# Patient Record
Sex: Male | Born: 1985 | Race: White | Hispanic: No | Marital: Single | Smoking: Former smoker
Health system: Southern US, Community
[De-identification: ages and names within clinical notes are randomized; demographics above are authoritative.]

## PROBLEM LIST (undated history)

## (undated) DIAGNOSIS — F1910 Other psychoactive substance abuse, uncomplicated: Secondary | ICD-10-CM

## (undated) DIAGNOSIS — A4902 Methicillin resistant Staphylococcus aureus infection, unspecified site: Secondary | ICD-10-CM

## (undated) DIAGNOSIS — F1120 Opioid dependence, uncomplicated: Secondary | ICD-10-CM

## (undated) DIAGNOSIS — T402X1A Poisoning by other opioids, accidental (unintentional), initial encounter: Secondary | ICD-10-CM

## (undated) DIAGNOSIS — F1110 Opioid abuse, uncomplicated: Secondary | ICD-10-CM

## (undated) DIAGNOSIS — B1920 Unspecified viral hepatitis C without hepatic coma: Secondary | ICD-10-CM

## (undated) DIAGNOSIS — F1990 Other psychoactive substance use, unspecified, uncomplicated: Secondary | ICD-10-CM

## (undated) HISTORY — PX: APPENDECTOMY: SHX54

---
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
## 2007-05-16 ENCOUNTER — Inpatient Hospital Stay (HOSPITAL_COMMUNITY): Admission: EM | Admit: 2007-05-16 | Discharge: 2007-05-17 | Payer: Self-pay | Admitting: General Surgery

## 2007-09-12 ENCOUNTER — Inpatient Hospital Stay (HOSPITAL_COMMUNITY): Admission: EM | Admit: 2007-09-12 | Discharge: 2007-09-13 | Payer: Self-pay | Admitting: Emergency Medicine

## 2007-09-13 ENCOUNTER — Encounter (INDEPENDENT_AMBULATORY_CARE_PROVIDER_SITE_OTHER): Payer: Self-pay | Admitting: General Surgery

## 2010-11-07 NOTE — H&P (Signed)
NAME:  Samuel Weber, Samuel Weber               ACCOUNT NO.:  [id number]   MEDICAL RECORD NO.:  [id number]          PATIENT TYPE:  INP   LOCATION:  1302                         FACILITY:  [facility]   PHYSICIAN:  Angelia Mould. Derrell Lolling, M.D.DATE OF BIRTH:  07/25/1985   DATE OF ADMISSION:  09/12/2007  DATE OF DISCHARGE:                              HISTORY & PHYSICAL   CHIEF COMPLAINT:  Abdominal pain.   HISTORY OF PRESENT ILLNESS:  This is a 25 year old white man who noted  the gradual onset of right lower quadrant pain at 2:00 p.m. yesterday,  Thursday, March 19.  He vomited yesterday.  The pain was pretty bad last  night.  It is not quite as bad today, but he still has pain.  He is  having normal bowel movements.  He is having no trouble voiding.  He has  had some chills but does not think he has had any fever.  He came to the  [facility] emergency room this evening for evaluation.  Lab work shows  an elevated white blood cell count, and a CT scan shows a thickened  inflamed appendix but no evidence of rupture or abscess.  I was called  to see him.  He is being admitted for management of his acute  appendicitis.   PAST MEDICAL HISTORY:  Abscessed right neck cyst excised by Dr. Estelle Grumbles in the past.  Mild asthma, on no treatment.   CURRENT MEDICATIONS:  None.   DRUG ALLERGIES:  None known.   FAMILY HISTORY:  Mother living.  Has had a history of melanoma and some  type of heart valve problem.  Father living and well.  The patient is an  only child.   SOCIAL HISTORY:  The patient is single, lives in [place] with his  mother and girlfriend.  He works for [employer], unloading trucks.  He smokes a  half a pack of cigarettes per day.  Drinks alcohol rarely.   REVIEW OF SYSTEMS:  Fifteen-system review of systems is performed and is  noncontributory except as described above.   PHYSICAL EXAMINATION:  GENERAL:  A thin, cooperative young man in mild  distress.  VITAL SIGNS:  Temperature 99.1, pulse 78,  respirations 16, blood  pressure 133/53.  HEENT:  Eyes:  Sclerae are clear.  Extraocular movements intact.  Ear,  nose, mouth and throat:  Nose, lips, tongue and oropharynx are without  gross lesions.  NECK:  Supple, nontender.  No mass.  Well-healed scar in the right  submandibular area.  No jugular venous distention.  LUNGS:  Clear to auscultation.  No wheezing.  No chest wall tenderness.  HEART:  Regular rate and rhythm.  No murmur.  Radial and posterior  tibial pulses are palpable.  No peripheral edema.  ABDOMEN:  Scaphoid.  He has localized tenderness and guarding in the  right lower quadrant, actually below McBurney's point.  There is no  mass.  It is fairly soft elsewhere.  There is no hernia.  There is no  inguinal mass or hernia either.  EXTREMITIES:  Moves all four extremities well without pain or  deformity.  NEUROLOGIC:  No gross motor or sensory deficits.   ADMISSION DATA:  A CT scan described above showing apparent  uncomplicated acute appendicitis.  Hemoglobin 15.6, white blood cell  count 16,700.  Complete metabolic panel basically normal.  Urinalysis is  positive for protein but otherwise negative.   ASSESSMENT:  Acute appendicitis.   PLAN:  1. The patient will be admitted, started on intravenous antibiotics.  2. He will be taken to the operating room for diagnostic laparoscopy      and appendectomy.   I have discussed the indications and details of the surgery with the  patient, his mother and his girlfriend.  Risks and complications have  been outlined, including but not limited to bleeding, infection,  conversion to open laparotomy.  Possibility of alternative diagnosis  mentioned.  Injury to adjacent organs such as major vascular structure,  ureter or intestine with major reconstructive surgery.  Cardiac,  pulmonary and thromboembolic problems.  He seems to understand these  issues well.  At this time, all of his questions are answered.  He is in  full  agreement with this plan.      Angelia Mould. Derrell Lolling, M.D.  Electronically Signed     HMI/MEDQ  D:  09/12/2007  T:  09/13/2007  Job:  [id number]   cc:   Stacie Acres. Cliffton Asters, M.D.  Fax: [phone]

## 2010-11-07 NOTE — Op Note (Signed)
NAME:  Samuel Weber, Samuel Weber               ACCOUNT NO.:  [id number]   MEDICAL RECORD NO.:  [id number]          PATIENT TYPE:  INP   LOCATION:  1302                         FACILITY:  [facility]   PHYSICIAN:  Angelia Mould. Derrell Lolling, M.D.DATE OF BIRTH:  [birth year]   DATE OF PROCEDURE:  09/13/2007  DATE OF DISCHARGE:                               OPERATIVE REPORT   PREOPERATIVE DIAGNOSIS:  Acute appendicitis.   POSTOPERATIVE DIAGNOSIS:  Acute appendicitis.   OPERATION PERFORMED:  Laparoscopic appendectomy.   SURGEON:  Angelia Mould. Derrell Lolling, M.D.   OPERATIVE INDICATIONS:  This is a 25 year old white man who presented to  the emergency room with a 24 hour history of right lower quadrant pain,  nausea and vomiting.  On exam he was found to have localized tenderness  in the right lower quadrant and right suprapubic area.  A CT scan  confirmed an inflamed appendix but no evidence of rupture.  He is  brought to the operating room urgently   OPERATIVE FINDINGS:  The appendix was acutely inflamed but had not  ruptured.  The omentum was adherent to it but was able to be peeled away  without too much trouble.  The last foot or two of terminal ileum looked  normal.  The cecum and right colon looked normal.  The liver and  gallbladder looked normal.  There were no abnormal findings in the  pelvis.   PROCEDURE IN DETAIL:  Following the induction of general endotracheal  anesthesia, Foley catheter was inserted.  The patient was identified as  the correct patient and correct procedure.  The abdomen was prepped and  draped in a sterile fashion.  Intravenous antibiotics had been given  prior to the incision.  Marcaine 0.5% with epinephrine was used as local  infiltration anesthetic.  A vertically oriented incision was made at the  superior rim of the umbilicus.  The fascia was incised in the midline  and the abdominal cavity entered under direct vision.  A 10 mm Hassan  trocar was inserted and secured with a  pursestring suture of zero  Vicryl.  Pneumoperitoneum was created.  Video camera was inserted with  visualization and findings as described above.  A 12 mm trocar was  placed in the left suprapubic area and a 5 mm trocar placed in the left  lower quadrant midway between the two trocars.  The omentum was  carefully teased away from the appendix.  The terminal ileum and cecum  were evaluated.  The appendix was grasped and elevated.  Some lateral  peritoneal attachments were divided with the Harmonic scalpel.  We could  then visualize clearly the anatomy.  We divided the appendiceal  mesentery with the Harmonic scalpel.  This took a few steps but  ultimately we had the appendix completely skeletonized so we could  clearly visualize the junction of the appendix with the cecum.  An Endo-  GIA stapling device was inserted and placed transversely across the base  of the appendix at the level of the cecum.  The stapler was closed, held  in place for about 30 seconds and then fired  and removed.  The staple  line looked good and it is very clean.  There was no bleeding and very  secure.  The appendix was placed in a specimen bag and removed.  The  operative field was irrigated a little bit but there was absolutely no  bleeding and all the irrigation fluid was clear and it was evacuated.  The trocars were removed.  There was no bleeding from the trocar sites.  The pneumoperitoneum was released.  The fascia at the umbilicus and the  fascia at the left suprapubic trocar site were closed with zero Vicryl  sutures.  The skin incisions were closed with subcuticular sutures of 4-  0 Monocryl and Dermabond.  Clean bandages were placed and the patient  was taken to the recovery room in stable condition.   ESTIMATED BLOOD LOSS:  Was about 10 mL.   COMPLICATIONS:  None.   COUNTS:  Sponge, needle and instrument counts were correct.      Angelia Mould. Derrell Lolling, M.D.  Electronically Signed     HMI/MEDQ   D:  09/13/2007  T:  09/13/2007  Job:  [id number]

## 2010-11-07 NOTE — Op Note (Signed)
NAME:  Samuel Weber, Samuel Weber               ACCOUNT NO.:  [id number]   MEDICAL RECORD NO.:  [id number]          PATIENT TYPE:  INP   LOCATION:  1529                         FACILITY:  [facility]   PHYSICIAN:  Ardeth Sportsman, MD     DATE OF BIRTH:  05-Mar-1986   DATE OF PROCEDURE:  05/16/2007  DATE OF DISCHARGE:                               OPERATIVE REPORT   PRIMARY CARE PHYSICIAN:  Unfortunately I do not have.   PREOPERATIVE DIAGNOSIS:  Right neck abscess.   POSTOPERATIVE DIAGNOSIS:  Infected cystic mass of right lateral neck.   PROCEDURE PERFORMED:  Incision and drainage of right neck abscess and  excision of cystic mass.   SURGEON:  Ardeth Sportsman, MD.   ASSISTANTS:  None.   ANESTHESIA:  1. General anesthesia.  2. Local anesthetic in a field block.   SPECIMENS:  Cystic abscess cavity fluid for culture, aerobic and  anaerobic, and cystic wall sent to Pathology.   DRAINS:  He has a one-inch wide Nugauze with some light Betadine packed  into the wound.   ESTIMATED BLOOD LOSS:  Less than 5 ml.   COMPLICATIONS:  None apparent.   INDICATIONS:  Mr. Makela is a 25 year old, otherwise healthy male, who  noticed a bulge in his right neck over his sternocleidomastoid  underneath the angle of his jaw that gradually increased in size.  Because it had worsened, he was evaluated in the Surgical Clinic today,  and recommendation was made for incision and drainage.  Given its  location and its size of being about 3 x 4 x 5 cm in size,  recommendation was made to do this in the operating room under a more  controlled situation.   Risks such as stroke, heart attack, DVT, thrombosis, pulmonary embolism,  and death were discussed.  Risks such as bleeding, need for transfusion,  hematoma, wound infection, abscess, injury to other organs, recurrence  of cystic mass, and other risks were discussed.  Question answered, and  he agrees to proceed.   OPERATIVE FINDINGS:  He had a large cystic mass with  a lot of thickened  sebum consistent with an infected sebaceous cyst with a thickened wall.  It rested primarily on the sternocleidomastoid and was somewhat anterior  to it inferior to the angle of the mandible.  It was contained and not  seen to be invading into the deeper neck structures but was primarily in  the superficial platysma.   DESCRIPTION OF PROCEDURE:  Informed consent was confirmed.  The patient  received IV Vancomycin.  He underwent a general anesthesia without any  difficulty.  He was positioned supine with his neck turned towards the  left and I exposed his right neck.  His face and chest were clipped,  prepped, and draped in a sterile fashion.  A seeker needle was used over  the most fluctuant area of the mass, and I was able to aspirate some  thickened cottage-cheese material.   A 1 cm oblique incision parallel to the sternocleidomastoid was made,  and we immediately evacuated a large amount of infected, cottage-cheese-  thick,  sebaceous material.  This was sent for culture.  The area was  irrigated out, and the cavity had an obvious cystic wall to it.  This  was freed off using a controlled blunt and sharp dissection and removed.  Copious irrigation was done with a nice clear return, and hemostasis was  excellent.  There was no evidence of any injury to any of the deeper  structures or involvement to any of the deeper structures.  I could not  detect any deeper cavity consistent with like a brachial cleft cyst or  any other abnormality.  The wound was irrigated copiously with saline  and packed with one-inch wide gauze gently soaked in Betadine with dry  gauze over it.  He was extubated and sent to the recovery room in stable  condition.   I explained the operative findings to patient's family.  Instructions  were discussed.  Questions answered, and each expressed understanding  and appreciation.      Ardeth Sportsman, MD  Electronically Signed     SCG/MEDQ   D:  05/16/2007  T:  05/17/2007  Job:  [id number]

## 2011-03-19 LAB — CBC
Hemoglobin: 15.6
MCHC: 34.8
RBC: 4.62

## 2011-03-19 LAB — URINALYSIS, ROUTINE W REFLEX MICROSCOPIC
Ketones, ur: NEGATIVE
Leukocytes, UA: NEGATIVE
Nitrite: NEGATIVE
Protein, ur: 30 — AB
Urobilinogen, UA: 1

## 2011-03-19 LAB — LIPASE, BLOOD: Lipase: 20

## 2011-03-19 LAB — COMPREHENSIVE METABOLIC PANEL
ALT: 15
Alkaline Phosphatase: 53
CO2: 25
Calcium: 8.8
GFR calc non Af Amer: >60
Glucose, Bld: 104 — ABNORMAL HIGH
Sodium: 130 — ABNORMAL LOW

## 2011-03-19 LAB — DIFFERENTIAL
Basophils Absolute: 0.1
Basophils Relative: 0
Eosinophils Absolute: 0
Lymphs Abs: 2.1
Neutrophils Relative %: 79 — ABNORMAL HIGH

## 2011-04-03 LAB — ANAEROBIC CULTURE

## 2011-04-03 LAB — BASIC METABOLIC PANEL
CO2: 23
Calcium: 9.2
Creatinine, Ser: 0.94
Glucose, Bld: 108 — ABNORMAL HIGH

## 2011-04-03 LAB — CULTURE, ROUTINE-ABSCESS

## 2011-04-03 LAB — CBC
MCHC: 34.6
MCV: 97.7
RDW: 13.2

## 2014-02-28 ENCOUNTER — Encounter (HOSPITAL_COMMUNITY): Payer: Self-pay | Admitting: Emergency Medicine

## 2014-02-28 ENCOUNTER — Emergency Department (HOSPITAL_COMMUNITY)
Admission: EM | Admit: 2014-02-28 | Discharge: 2014-03-01 | Disposition: A | Discharge status: Home / Self Care | Payer: Self-pay | Attending: Emergency Medicine | Admitting: Emergency Medicine

## 2014-02-28 ENCOUNTER — Emergency Department (HOSPITAL_COMMUNITY): Payer: Self-pay

## 2014-02-28 DIAGNOSIS — F1123 Opioid dependence with withdrawal: Secondary | ICD-10-CM

## 2014-02-28 DIAGNOSIS — F1110 Opioid abuse, uncomplicated: Secondary | ICD-10-CM

## 2014-02-28 DIAGNOSIS — J209 Acute bronchitis, unspecified: Secondary | ICD-10-CM | POA: Insufficient documentation

## 2014-02-28 DIAGNOSIS — R636 Underweight: Secondary | ICD-10-CM | POA: Insufficient documentation

## 2014-02-28 DIAGNOSIS — J40 Bronchitis, not specified as acute or chronic: Secondary | ICD-10-CM

## 2014-02-28 DIAGNOSIS — R45851 Suicidal ideations: Secondary | ICD-10-CM

## 2014-02-28 DIAGNOSIS — F17200 Nicotine dependence, unspecified, uncomplicated: Secondary | ICD-10-CM | POA: Insufficient documentation

## 2014-02-28 HISTORY — DX: Opioid abuse, uncomplicated: F11.10

## 2014-02-28 LAB — COMPREHENSIVE METABOLIC PANEL
ALK PHOS: 61 U/L (ref 39–117)
ALT: 5 U/L (ref 0–53)
ANION GAP: 14 (ref 5–15)
AST: 10 U/L (ref 0–37)
Albumin: 3.8 g/dL (ref 3.5–5.2)
BILIRUBIN TOTAL: 0.2 mg/dL — AB (ref 0.3–1.2)
BUN: 12 mg/dL (ref 6–23)
CHLORIDE: 104 meq/L (ref 96–112)
CO2: 22 mEq/L (ref 19–32)
Calcium: 9.6 mg/dL (ref 8.4–10.5)
Creatinine, Ser: 0.91 mg/dL (ref 0.50–1.35)
GFR calc non Af Amer: >90 mL/min (ref >90)
GLUCOSE: 92 mg/dL (ref 70–99)
POTASSIUM: 4.3 meq/L (ref 3.7–5.3)
Sodium: 140 mEq/L (ref 137–147)
Total Protein: 7.9 g/dL (ref 6.0–8.3)

## 2014-02-28 LAB — CBC
HEMATOCRIT: 42 % (ref 39.0–52.0)
HEMOGLOBIN: 14.2 g/dL (ref 13.0–17.0)
MCH: 32.3 pg (ref 26.0–34.0)
MCHC: 33.8 g/dL (ref 30.0–36.0)
MCV: 95.7 fL (ref 78.0–100.0)
Platelets: 381 10*3/uL (ref 150–400)
RBC: 4.39 MIL/uL (ref 4.22–5.81)
RDW: 13.1 % (ref 11.5–15.5)
WBC: 10.1 10*3/uL (ref 4.0–10.5)

## 2014-02-28 LAB — ACETAMINOPHEN LEVEL

## 2014-02-28 LAB — ETHANOL: Alcohol, Ethyl (B): <11 mg/dL (ref 0–11)

## 2014-02-28 LAB — SALICYLATE LEVEL: Salicylate Lvl: <2 mg/dL — ABNORMAL LOW (ref 2.8–20.0)

## 2014-02-28 MED ORDER — CLONIDINE HCL 0.1 MG PO TABS
0.1000 mg | ORAL_TABLET | Freq: Four times a day (QID) | ORAL | Status: DC
  Administered 2014-02-28 – 2014-03-01 (×4): 0.1 mg via ORAL
  Filled 2014-02-28 (×4): qty 1

## 2014-02-28 MED ORDER — AZITHROMYCIN 250 MG PO TABS
250.0000 mg | ORAL_TABLET | Freq: Every day | ORAL | Status: DC
  Administered 2014-03-01: 250 mg via ORAL
  Filled 2014-02-28: qty 1

## 2014-02-28 MED ORDER — NAPROXEN 500 MG PO TABS
500.0000 mg | ORAL_TABLET | Freq: Two times a day (BID) | ORAL | Status: DC | PRN
  Administered 2014-03-01: 500 mg via ORAL
  Filled 2014-02-28: qty 1

## 2014-02-28 MED ORDER — AZITHROMYCIN 250 MG PO TABS
500.0000 mg | ORAL_TABLET | Freq: Every day | ORAL | Status: AC
  Administered 2014-02-28: 500 mg via ORAL
  Filled 2014-02-28: qty 2

## 2014-02-28 MED ORDER — METHOCARBAMOL 500 MG PO TABS
1000.0000 mg | ORAL_TABLET | Freq: Four times a day (QID) | ORAL | Status: DC | PRN
  Administered 2014-03-01: 1000 mg via ORAL
  Filled 2014-02-28: qty 2

## 2014-02-28 MED ORDER — ALBUTEROL SULFATE (2.5 MG/3ML) 0.083% IN NEBU
5.0000 mg | INHALATION_SOLUTION | Freq: Once | RESPIRATORY_TRACT | Status: AC
  Administered 2014-02-28: 5 mg via RESPIRATORY_TRACT
  Filled 2014-02-28: qty 6

## 2014-02-28 MED ORDER — ALBUTEROL SULFATE (2.5 MG/3ML) 0.083% IN NEBU
5.0000 mg | INHALATION_SOLUTION | RESPIRATORY_TRACT | Status: DC | PRN
  Filled 2014-02-28: qty 6

## 2014-02-28 MED ORDER — CLONIDINE HCL 0.1 MG PO TABS: 0.1000 mg | ORAL_TABLET | ORAL | Status: DC

## 2014-02-28 MED ORDER — LOPERAMIDE HCL 2 MG PO CAPS: 2.0000 mg | ORAL_CAPSULE | ORAL | Status: DC | PRN

## 2014-02-28 MED ORDER — CLONIDINE HCL 0.1 MG PO TABS: 0.1000 mg | ORAL_TABLET | Freq: Every day | ORAL | Status: DC

## 2014-02-28 MED ORDER — DM-GUAIFENESIN ER 30-600 MG PO TB12
1.0000 | ORAL_TABLET | Freq: Two times a day (BID) | ORAL | Status: DC | PRN
  Filled 2014-02-28: qty 1

## 2014-02-28 MED ORDER — HYDROXYZINE HCL 25 MG PO TABS
50.0000 mg | ORAL_TABLET | Freq: Four times a day (QID) | ORAL | Status: DC | PRN
  Administered 2014-02-28: 50 mg via ORAL
  Filled 2014-02-28: qty 2

## 2014-02-28 MED ORDER — DICYCLOMINE HCL 20 MG PO TABS: 20.0000 mg | ORAL_TABLET | Freq: Four times a day (QID) | ORAL | Status: DC | PRN

## 2014-02-28 MED ORDER — NICOTINE 21 MG/24HR TD PT24
21.0000 mg | MEDICATED_PATCH | Freq: Every day | TRANSDERMAL | Status: DC
  Administered 2014-02-28 – 2014-03-01 (×2): 21 mg via TRANSDERMAL
  Filled 2014-02-28 (×2): qty 1

## 2014-02-28 MED ORDER — ACETAMINOPHEN 325 MG PO TABS: 650.0000 mg | ORAL_TABLET | ORAL | Status: DC | PRN

## 2014-02-28 MED ORDER — ALUM & MAG HYDROXIDE-SIMETH 200-200-20 MG/5ML PO SUSP: 30.0000 mL | ORAL | Status: DC | PRN

## 2014-02-28 MED ORDER — ONDANSETRON 4 MG PO TBDP: 4.0000 mg | ORAL_TABLET | Freq: Four times a day (QID) | ORAL | Status: DC | PRN

## 2014-02-28 MED ORDER — IPRATROPIUM BROMIDE 0.02 % IN SOLN
0.5000 mg | Freq: Once | RESPIRATORY_TRACT | Status: AC
  Administered 2014-02-28: 0.5 mg via RESPIRATORY_TRACT
  Filled 2014-02-28: qty 2.5

## 2014-02-28 NOTE — BH Assessment (Signed)
Assessment completed. Consulted Dr. Jannifer Franklin who recommended inpatient treatment. [facility] at capacity. TTS will contact other facilities for placement. Dr. Lynelle Doctor is aware of recommendation.

## 2014-02-28 NOTE — BH Assessment (Signed)
Tele Assessment Note   Samuel Weber is an 28 y.o. male presenting to [facility] ED with suicidal ideations and a plan to overdose on heroin. Pt stated "I am tired of waking up feeling like this".   Pt is endorsing SI with a plan to overdose on heroin. Pt did not report any previous suicide attempts and denied ever being hospitalized for any psychiatric issues. Pt did not report any previous mental health treatment. Pt is endorsing multiple depressive symptoms and shared that his sleep and appetite are poor. Pt also shared that he his dealing with multiple stressors such as relationship problems, financial issues as well as legal issues. Pt denies HI and AVH at this time. Pt reported that he has access to knives and shared that he has a pending DUI charge but is unaware of his court date. Pt stated "I think it's in December". PT reported that he has been abusing heroin for the past 2 years and he uses up to 2 grams a day. Pt did not report any alcohol use and denied any other drug use. Pt did not report any physical, sexual or emotional abuse at this time. Pt reported that he lives at home with his mother and she is a part of his support system. Pt is currently unemployed.  Pt is drowsy but oriented x3. Pt is calm and cooperative. Pt is dressed in scrubs and maintains poor eye contact. Pt speech is soft and mumbled at times. Pt mood is depressed and his affect is congruent with his mood. Pt thought process is coherent and relevant and his judgement is fair.  Axis I: Opioid use disorder, Moderate Axis II: Deferred Axis III:  Past Medical History  Diagnosis Date  . Heroin abuse    Axis IV: economic problems, occupational problems and problems related to legal system/crime Axis V: 11-20 some danger of hurting self or others possible OR occasionally fails to maintain minimal personal hygiene OR gross impairment in communication  Past Medical History:  Past Medical History  Diagnosis Date  . Heroin abuse      Past Surgical History  Procedure Laterality Date  . Appendectomy      Family History: History reviewed. No pertinent family history.  Social History:  reports that he has been smoking.  He does not have any smokeless tobacco history on file. He reports that he uses illicit drugs (IV, Cocaine, and Marijuana). He reports that he does not drink alcohol.  Additional Social History:  Alcohol / Drug Use History of alcohol / drug use?: Yes Substance #1 Name of Substance 1: Hreroin  1 - Age of First Use: 25 1 - Amount (size/oz): 2 grams  1 - Frequency: daily  1 - Duration: 2 years  1 - Last Use / Amount: 02-28-14 @ 3am- 1 shot   CIWA: CIWA-Ar BP: 126/88 mmHg Pulse Rate: 87 COWS:    PATIENT STRENGTHS: (choose at least two) Average or above average intelligence Supportive family/friends  Allergies: No Known Allergies  Home Medications:  (Not in a hospital admission)  OB/GYN Status:  No LMP for male patient.  General Assessment Data Location of Assessment: [facility] ED Is this a Tele or Face-to-Face Assessment?: Face-to-Face Is this an Initial Assessment or a Re-assessment for this encounter?: Initial Assessment Living Arrangements: Parent Can pt return to current living arrangement?: Yes Admission Status: Voluntary Is patient capable of signing voluntary admission?: Yes Transfer from: Home Referral Source: Self/Family/Friend     [facility] Crisis Care Plan Living Arrangements:  Parent Name of Psychiatrist: None reported Name of Therapist: None reported  Education Status Is patient currently in school?: No Current Grade: NA Highest grade of school patient has completed: GED Name of school: NA Contact person: NA  Risk to self with the past 6 months Suicidal Ideation: Yes-Currently Present Suicidal Intent: Yes-Currently Present Is patient at risk for suicide?: Yes Suicidal Plan?: Yes-Currently Present Specify Current Suicidal Plan: Overdose on heroin  Access to Means:  Yes Specify Access to Suicidal Means: Pt abuses heroin daily. What has been your use of drugs/alcohol within the last 12 months?: Heroin use daily.  Previous Attempts/Gestures: No How many times?: 0 Other Self Harm Risks: No other self harm risk identified at this time. Triggers for Past Attempts: None known Intentional Self Injurious Behavior: None Family Suicide History: No Recent stressful life event(s): Financial Problems;Legal Issues;Other (Comment) (Relationship issues) Persecutory voices/beliefs?: No Depression: Yes Depression Symptoms: Despondent;Insomnia;Fatigue;Guilt;Loss of interest in usual pleasures;Feeling worthless/self pity;Feeling angry/irritable Substance abuse history and/or treatment for substance abuse?: Yes Suicide prevention information given to non-admitted patients: Not applicable  Risk to Others within the past 6 months Homicidal Ideation: No Thoughts of Harm to Others: No Current Homicidal Intent: No Current Homicidal Plan: No Access to Homicidal Means: No Identified Victim: NA History of harm to others?: No Assessment of Violence: None Noted Violent Behavior Description: No violent behaviors observed. Pt is calm and cooperative at this time. Does patient have access to weapons?: Yes (Comment) (Knives) Criminal Charges Pending?: Yes Describe Pending Criminal Charges: DUI Does patient have a court date: Yes Court Date:  (December 2015)  Psychosis Hallucinations: None noted Delusions: None noted  Mental Status Report Appear/Hygiene: In scrubs Eye Contact: Poor Motor Activity: Freedom of movement Speech: Soft Level of Consciousness: Quiet/awake Mood: Depressed Affect: Appropriate to circumstance Anxiety Level: None Thought Processes: Coherent;Relevant Judgement: Partial Orientation: Place;Person;Situation Obsessive Compulsive Thoughts/Behaviors: None  Cognitive Functioning Concentration: Normal Memory: Recent Intact IQ: Average Insight:  Fair Impulse Control: Fair Appetite: Poor Weight Loss: 0 Weight Gain: 0 Sleep: Decreased Total Hours of Sleep: 2 Vegetative Symptoms: Decreased grooming  ADLScreening [facility] Assessment Services) Patient's cognitive ability adequate to safely complete daily activities?: Yes Patient able to express need for assistance with ADLs?: Yes Independently performs ADLs?: Yes (appropriate for developmental age)  Prior Inpatient Therapy Prior Inpatient Therapy: No  Prior Outpatient Therapy Prior Outpatient Therapy: No  ADL Screening (condition at time of admission) Patient's cognitive ability adequate to safely complete daily activities?: Yes Is the patient deaf or have difficulty hearing?: No Does the patient have difficulty seeing, even when wearing glasses/contacts?: No Does the patient have difficulty concentrating, remembering, or making decisions?: No Patient able to express need for assistance with ADLs?: Yes Does the patient have difficulty dressing or bathing?: No Independently performs ADLs?: Yes (appropriate for developmental age) Does the patient have difficulty walking or climbing stairs?: No       Abuse/Neglect Assessment (Assessment to be complete while patient is alone) Physical Abuse: Denies Verbal Abuse: Denies Sexual Abuse: Denies Exploitation of patient/patient's resources: Denies Self-Neglect: Denies Values / Beliefs Cultural Requests During Hospitalization: None Spiritual Requests During Hospitalization: None   Advance Directives (For Healthcare) Does patient have an advance directive?: No    Additional Information 1:1 In Past 12 Months?: No CIRT Risk: No Elopement Risk: No Does patient have medical clearance?: Yes     Disposition:  Disposition Initial Assessment Completed for this Encounter: Yes Disposition of Patient: Inpatient treatment program Type of inpatient treatment program: Adult  Lawana Hartzell S 02/28/2014 8:27 PM

## 2014-02-28 NOTE — ED Provider Notes (Signed)
CSN: [id number]     Arrival date & time 02/28/14  1716 History   First MD Initiated Contact with Patient 02/28/14 1759     Chief Complaint  Patient presents with  . Suicidal   . Opiate Detox      (Consider location/radiation/quality/duration/timing/severity/associated sxs/prior Treatment) HPI Patient states he has been abusing heroin for about 2 years. He states he has never done rehabilitation or detox. He states "I'm tired of using". When asked if he is depressed he states no. However his mother expresses concern that he's going to intentionally hurt himself and he admits that he would do that. Patient states he's using 2 g daily. He states his girlfriend gives him the heroin for free. She states she does not have a steady job. Patient was working in [employer] however he's not worked for couple months. Patient states he was arrested for selling drugs about 3 years ago, he also currently has a failure to appear for a traffic ticket. Patient states he is tired of waking up in feeling sad because he is in withdrawal. He states he is tired of being an addict. Patient also has had a cough for several weeks, sometimes he has some wheezing. His mother states when he was a child he had to have nebulizers and inhalers. His mother states she just found out he was getting the heroin although she had suspected something was happening.  PCP none  Past Medical History  Diagnosis Date  . Heroin abuse    Past Surgical History  Procedure Laterality Date  . Appendectomy     History reviewed. No pertinent family history. History  Substance Use Topics  . Smoking status: Current Every Day Smoker  . Smokeless tobacco: Not on file  . Alcohol Use: No   unemployed for a few months Smokes one pack per day  Review of Systems  All other systems reviewed and are negative.     Allergies  Review of patient's allergies indicates no known allergies.  Home Medications   Prior to Admission medications    Not on File   BP 126/88  Pulse 87  Temp(Src) 98.3 F (36.8 C) (Oral)  Resp 16  SpO2 100%  Vital signs normal   Physical Exam  Nursing note and vitals reviewed. Constitutional: He is oriented to person, place, and time.  Non-toxic appearance. He does not appear ill. No distress.  Underweight, laying on his side curled in a ball  HENT:  Head: Normocephalic and atraumatic.  Right Ear: External ear normal.  Left Ear: External ear normal.  Nose: Nose normal. No mucosal edema or rhinorrhea.  Mouth/Throat: Oropharynx is clear and moist and mucous membranes are normal. No dental abscesses or uvula swelling.  Having rhinorrhea  Eyes: Conjunctivae and EOM are normal. Pupils are equal, round, and reactive to light.  Neck: Normal range of motion and full passive range of motion without pain. Neck supple.  Cardiovascular: Normal rate, regular rhythm and normal heart sounds.  Exam reveals no gallop and no friction rub.   No murmur heard. Pulmonary/Chest: Effort normal. No respiratory distress. He has no wheezes. He has rhonchi. He has no rales. He exhibits no tenderness and no crepitus.  Coughing frequently with course breath sounds and scattered rhonchi  Abdominal: Soft. Normal appearance and bowel sounds are normal. He exhibits no distension. There is no tenderness. There is no rebound and no guarding.  Musculoskeletal: Normal range of motion. He exhibits no edema and no tenderness.  Moves all extremities  well.   Neurological: He is alert and oriented to person, place, and time. He has normal strength. No cranial nerve deficit.  Skin: Skin is warm, dry and intact. No rash noted. No erythema. No pallor.  Psychiatric: He has a normal mood and affect. His speech is normal and behavior is normal. His mood appears not anxious.    ED Course  Procedures (including critical care time)  Medications  acetaminophen (TYLENOL) tablet 650 mg (not administered)  nicotine (NICODERM CQ - dosed in mg/24  hours) patch 21 mg (21 mg Transdermal Patch Applied 02/28/14 1937)  alum & mag hydroxide-simeth (MAALOX/MYLANTA) 200-200-20 MG/5ML suspension 30 mL (not administered)  dicyclomine (BENTYL) tablet 20 mg (not administered)  hydrOXYzine (ATARAX/VISTARIL) tablet 50 mg (not administered)  loperamide (IMODIUM) capsule 2-4 mg (not administered)  methocarbamol (ROBAXIN) tablet 1,000 mg (not administered)  naproxen (NAPROSYN) tablet 500 mg (not administered)  ondansetron (ZOFRAN-ODT) disintegrating tablet 4 mg (not administered)  cloNIDine (CATAPRES) tablet 0.1 mg (0.1 mg Oral Given 02/28/14 1937)    Followed by  cloNIDine (CATAPRES) tablet 0.1 mg (not administered)    Followed by  cloNIDine (CATAPRES) tablet 0.1 mg (not administered)  dextromethorphan-guaiFENesin (MUCINEX DM) 30-600 MG per 12 hr tablet 1 tablet (not administered)  albuterol (PROVENTIL) (2.5 MG/3ML) 0.083% nebulizer solution 5 mg (not administered)  azithromycin (ZITHROMAX) tablet 500 mg (500 mg Oral Given 02/28/14 1937)    Followed by  azithromycin (ZITHROMAX) tablet 250 mg (not administered)  albuterol (PROVENTIL) (2.5 MG/3ML) 0.083% nebulizer solution 5 mg (5 mg Nebulization Given 02/28/14 1841)  ipratropium (ATROVENT) nebulizer solution 0.5 mg (0.5 mg Nebulization Given 02/28/14 1841)   Started on antibiotics for his bronchitis. He was given nebulizer treatment for his shortness of breath and wheezing.  19:39 [facility] called to discuss reason for consult  20:10 [facility] called, they recommend inpatient treatment. No beds at [facility] tonight.   Labs Review Results for orders placed during the hospital encounter of 02/28/14  ACETAMINOPHEN LEVEL      Result Value Ref Range   Acetaminophen (Tylenol), Serum <15.0  10 - 30 ug/mL  CBC      Result Value Ref Range   WBC 10.1  4.0 - 10.5 K/uL   RBC 4.39  4.22 - 5.81 MIL/uL   Hemoglobin 14.2  13.0 - 17.0 g/dL   HCT 16.1  09.6 - 04.5 %   MCV 95.7  78.0 - 100.0 fL   MCH 32.3  26.0 - 34.0 pg   MCHC 33.8   30.0 - 36.0 g/dL   RDW 40.9  81.1 - 91.4 %   Platelets 381  150 - 400 K/uL  COMPREHENSIVE METABOLIC PANEL      Result Value Ref Range   Sodium 140  137 - 147 mEq/L   Potassium 4.3  3.7 - 5.3 mEq/L   Chloride 104  96 - 112 mEq/L   CO2 22  19 - 32 mEq/L   Glucose, Bld 92  70 - 99 mg/dL   BUN 12  6 - 23 mg/dL   Creatinine, Ser 7.82  0.50 - 1.35 mg/dL   Calcium 9.6  8.4 - 95.6 mg/dL   Total Protein 7.9  6.0 - 8.3 g/dL   Albumin 3.8  3.5 - 5.2 g/dL   AST 10  0 - 37 U/L   ALT 5  0 - 53 U/L   Alkaline Phosphatase 61  39 - 117 U/L   Total Bilirubin 0.2 (*) 0.3 - 1.2 mg/dL   GFR calc  non Af Amer >90  >90 mL/min   GFR calc Af Amer >90  >90 mL/min   Anion gap 14  5 - 15  ETHANOL      Result Value Ref Range   Alcohol, Ethyl (B) <11  0 - 11 mg/dL  SALICYLATE LEVEL      Result Value Ref Range   Salicylate Lvl <2.0 (*) 2.8 - 20.0 mg/dL   Laboratory interpretation all normal except pending UDS      Imaging Review Dg Chest 2 View  02/28/2014   CLINICAL DATA:  Cough  EXAM: CHEST  2 VIEW  COMPARISON:  None.  FINDINGS: Lungs are clear.  No pleural effusion or pneumothorax.  The heart is normal in size.  Visualized osseous structures are within normal limits.  IMPRESSION: No evidence of acute cardiopulmonary disease.   Electronically Signed   By: Charline Bills M.D.   On: 02/28/2014 18:34     EKG Interpretation None      MDM   Final diagnoses:  Heroin abuse  Suicidal ideation  Bronchitis    Disposition pending  Devoria Albe, MD, Armando Gang     Ward Givens, MD 02/28/14 2234

## 2014-02-28 NOTE — ED Notes (Signed)
Pt states that he has been using heroin and wants to get off of it.  Pt states he is suicidal.  Started shooting it up 2 months ago.  Before that, he snorted.  Plan to OD on heroin.  Last use was 0300 this morning.

## 2014-02-28 NOTE — ED Notes (Signed)
Pt presents for detox from Heroin, SI with plan to OD on Heroin.  Last used Heroin at 3am.  Denies HI or AV hallucinations.  Pt reports no psych history, feeling hopeless.  Pt calm & cooperative at present.

## 2014-03-01 DIAGNOSIS — F1410 Cocaine abuse, uncomplicated: Secondary | ICD-10-CM

## 2014-03-01 DIAGNOSIS — F1120 Opioid dependence, uncomplicated: Secondary | ICD-10-CM

## 2014-03-01 DIAGNOSIS — F1123 Opioid dependence with withdrawal: Secondary | ICD-10-CM | POA: Diagnosis present

## 2014-03-01 DIAGNOSIS — F1210 Cannabis abuse, uncomplicated: Secondary | ICD-10-CM

## 2014-03-01 LAB — RAPID URINE DRUG SCREEN, HOSP PERFORMED
Amphetamines: NOT DETECTED
BARBITURATES: NOT DETECTED
Benzodiazepines: NOT DETECTED
Cocaine: POSITIVE — AB
Opiates: POSITIVE — AB
TETRAHYDROCANNABINOL: POSITIVE — AB

## 2014-03-01 NOTE — Consult Note (Signed)
[name]   Reason for Consult: "Patient requesting detox from heroin.'' Referring Physician:  [name] is an 28 y.o. male. Total Time spent with patient: 45 minutes  Assessment: AXIS I:  Opioid dependence with withdrawal               Cocaine abuse                Cannabis abuse AXIS II:  Cluster B Traits AXIS III:   Past Medical History  Diagnosis Date  . IV Track marks    AXIS IV:  other psychosocial or environmental problems and problems related to social environment AXIS V:  41-50 serious symptoms  Plan:  No evidence of imminent risk to self or others at present.   Patient does not meet criteria for psychiatric inpatient admission. Drugs residential treatment services recommended  Subjective:   Samuel Weber is a 28 y.o. male patient who presents with suicidal thoughts with no plan and Opioid withdrawal.  HPI:  Samuel Weber is 28 year old male who presents to the ER reporting that he has been abusing heroin for about 2 years almost on a daily basis. He states he has never done rehabilitation or detox. He states "I'm tired of using". When asked if he is depressed he states no. However his mother expresses concern that he's going to intentionally hurt himself and he admits that he would do that only if he does not get clean from using drugs. Patient states he's using 2 g of Heroine daily by "shooting up". He states his girlfriend gives him the heroin for free. Patient reports that he also uses cocaine and Marijuana occasionally. He is currently unemployed, used to work in [employer] however he's not worked for couple months. Patient states he was arrested for selling drugs about 3 years ago, he also currently has a failure to appear for a traffic ticket. Patient states he is tired of waking up and  Feeling anxious, having muscle pain, tiredness, diarrhea, rhinorrhea,  because he is in withdrawal. He states he is tired of being an addict. He denies  feeling suicidal or depressed today, he also denies auditory/visual hallucinations. Patient is currently on probation for selling Ecstasy and has a pending court date in December, 2015.  HPI Elements:   Location:  polysubstance abuse, opioid dependence. Severity:  daily use of Heroin. Duration:  2 years. Context:  frustrated with self .  Past Psychiatric History: Past Medical History  Diagnosis Date  . Heroin abuse     reports that he has been smoking.  He does not have any smokeless tobacco history on file. He reports that he uses illicit drugs (IV, Cocaine, and Marijuana). He reports that he does not drink alcohol. History reviewed. No pertinent family history. Family History Substance Abuse: No Family Supports: Yes, List: (Mother ) Living Arrangements: Parent Can pt return to current living arrangement?: Yes Abuse/Neglect [facility]) Physical Abuse: Denies Verbal Abuse: Denies Sexual Abuse: Denies Allergies:  No Known Allergies  ACT Assessment Complete:  Yes:    Educational Status    Risk to Self: Risk to self with the past 6 months Suicidal Ideation: Yes-Currently Present Suicidal Intent: Yes-Currently Present Is patient at risk for suicide?: Yes Suicidal Plan?: Yes-Currently Present Specify Current Suicidal Plan: Overdose on heroin  Access to Means: Yes Specify Access to Suicidal Means: Pt abuses heroin daily. What has been your use of drugs/alcohol within the last 12 months?: Heroin use daily.  Previous Attempts/Gestures:  No How many times?: 0 Other Self Harm Risks: No other self harm risk identified at this time. Triggers for Past Attempts: None known Intentional Self Injurious Behavior: None Family Suicide History: No Recent stressful life event(s): Financial Problems;Legal Issues;Other (Comment) (Relationship issues) Persecutory voices/beliefs?: No Depression: Yes Depression Symptoms: Despondent;Insomnia;Fatigue;Guilt;Loss of interest in usual pleasures;Feeling  worthless/self pity;Feeling angry/irritable Substance abuse history and/or treatment for substance abuse?: Yes Suicide prevention information given to non-admitted patients: Not applicable  Risk to Others: Risk to Others within the past 6 months Homicidal Ideation: No Thoughts of Harm to Others: No Current Homicidal Intent: No Current Homicidal Plan: No Access to Homicidal Means: No Identified Victim: NA History of harm to others?: No Assessment of Violence: None Noted Violent Behavior Description: No violent behaviors observed. Pt is calm and cooperative at this time. Does patient have access to weapons?: Yes (Comment) (Knives) Criminal Charges Pending?: Yes Describe Pending Criminal Charges: DUI Does patient have a court date: Yes Court Date:  (December 2015)  Abuse: Abuse/Neglect Assessment (Assessment to be complete while patient is alone) Physical Abuse: Denies Verbal Abuse: Denies Sexual Abuse: Denies Exploitation of patient/patient's resources: Denies Self-Neglect: Denies  Prior Inpatient Therapy: Prior Inpatient Therapy Prior Inpatient Therapy: No  Prior Outpatient Therapy: Prior Outpatient Therapy Prior Outpatient Therapy: No  Additional Information: Additional Information 1:1 In Past 12 Months?: No CIRT Risk: No Elopement Risk: No Does patient have medical clearance?: Yes                  Objective: Blood pressure 124/48, pulse 64, temperature 97 F (36.1 C), temperature source Oral, resp. rate 18, SpO2 97.00%.There is no height or weight on file to calculate BMI. Results for orders placed during the hospital encounter of 02/28/14 (from the past 72 hour(s))  ACETAMINOPHEN LEVEL     Status: None   Collection Time    02/28/14  5:36 PM      Result Value Ref Range   Acetaminophen (Tylenol), Serum <15.0  10 - 30 ug/mL   Comment:            THERAPEUTIC CONCENTRATIONS VARY     SIGNIFICANTLY. A RANGE OF 10-30     ug/mL MAY BE AN EFFECTIVE      CONCENTRATION FOR MANY PATIENTS.     HOWEVER, SOME ARE BEST TREATED     AT CONCENTRATIONS OUTSIDE THIS     RANGE.     ACETAMINOPHEN CONCENTRATIONS     >150 ug/mL AT 4 HOURS AFTER     INGESTION AND >50 ug/mL AT 12     HOURS AFTER INGESTION ARE     OFTEN ASSOCIATED WITH TOXIC     REACTIONS.  CBC     Status: None   Collection Time    02/28/14  5:36 PM      Result Value Ref Range   WBC 10.1  4.0 - 10.5 K/uL   RBC 4.39  4.22 - 5.81 MIL/uL   Hemoglobin 14.2  13.0 - 17.0 g/dL   HCT 42.0  39.0 - 52.0 %   MCV 95.7  78.0 - 100.0 fL   MCH 32.3  26.0 - 34.0 pg   MCHC 33.8  30.0 - 36.0 g/dL   RDW 13.1  11.5 - 15.5 %   Platelets 381  150 - 400 K/uL  COMPREHENSIVE METABOLIC PANEL     Status: Abnormal   Collection Time    02/28/14  5:36 PM      Result Value Ref Range   Sodium  140  137 - 147 mEq/L   Potassium 4.3  3.7 - 5.3 mEq/L   Chloride 104  96 - 112 mEq/L   CO2 22  19 - 32 mEq/L   Glucose, Bld 92  70 - 99 mg/dL   BUN 12  6 - 23 mg/dL   Creatinine, Ser 0.91  0.50 - 1.35 mg/dL   Calcium 9.6  8.4 - 10.5 mg/dL   Total Protein 7.9  6.0 - 8.3 g/dL   Albumin 3.8  3.5 - 5.2 g/dL   AST 10  0 - 37 U/L   ALT 5  0 - 53 U/L   Alkaline Phosphatase 61  39 - 117 U/L   Total Bilirubin 0.2 (*) 0.3 - 1.2 mg/dL   GFR calc non Af Amer >90  >90 mL/min   GFR calc Af Amer >90  >90 mL/min   Comment: (NOTE)     The eGFR has been calculated using the CKD EPI equation.     This calculation has not been validated in all clinical situations.     eGFR's persistently <90 mL/min signify possible Chronic Kidney     Disease.   Anion gap 14  5 - 15  ETHANOL     Status: None   Collection Time    02/28/14  5:36 PM      Result Value Ref Range   Alcohol, Ethyl (B) <11  0 - 11 mg/dL   Comment:            LOWEST DETECTABLE LIMIT FOR     SERUM ALCOHOL IS 11 mg/dL     FOR MEDICAL PURPOSES ONLY  SALICYLATE LEVEL     Status: Abnormal   Collection Time    02/28/14  5:36 PM      Result Value Ref Range   Salicylate  Lvl <5.6 (*) 2.8 - 20.0 mg/dL  URINE RAPID DRUG SCREEN (HOSP PERFORMED)     Status: Abnormal   Collection Time    03/01/14  1:42 AM      Result Value Ref Range   Opiates POSITIVE (*) NONE DETECTED   Cocaine POSITIVE (*) NONE DETECTED   Benzodiazepines NONE DETECTED  NONE DETECTED   Amphetamines NONE DETECTED  NONE DETECTED   Tetrahydrocannabinol POSITIVE (*) NONE DETECTED   Barbiturates NONE DETECTED  NONE DETECTED   Comment:            DRUG SCREEN FOR MEDICAL PURPOSES     ONLY.  IF CONFIRMATION IS NEEDED     FOR ANY PURPOSE, NOTIFY LAB     WITHIN 5 DAYS.                LOWEST DETECTABLE LIMITS     FOR URINE DRUG SCREEN     Drug Class       Cutoff (ng/mL)     Amphetamine      1000     Barbiturate      200     Benzodiazepine   213     Tricyclics       086     Opiates          300     Cocaine          300     THC              50   Labs are reviewed and are pertinent for positive opiate, THC and cocaine  Current Facility-Administered Medications  Medication Dose Route Frequency Provider Last  Rate Last Dose  . acetaminophen (TYLENOL) tablet 650 mg  650 mg Oral Q4H PRN Janice Norrie, MD      . albuterol (PROVENTIL) (2.5 MG/3ML) 0.083% nebulizer solution 5 mg  5 mg Nebulization Q4H PRN Janice Norrie, MD      . alum & mag hydroxide-simeth (MAALOX/MYLANTA) 200-200-20 MG/5ML suspension 30 mL  30 mL Oral PRN Janice Norrie, MD      . azithromycin (ZITHROMAX) tablet 250 mg  250 mg Oral Daily Janice Norrie, MD   250 mg at 03/01/14 0934  . cloNIDine (CATAPRES) tablet 0.1 mg  0.1 mg Oral QID Janice Norrie, MD   0.1 mg at 03/01/14 0934   Followed by  . [START ON 03/03/2014] cloNIDine (CATAPRES) tablet 0.1 mg  0.1 mg Oral BH-qamhs Janice Norrie, MD       Followed by  . [START ON 03/05/2014] cloNIDine (CATAPRES) tablet 0.1 mg  0.1 mg Oral QAC breakfast Janice Norrie, MD      . dextromethorphan-guaiFENesin ([place] DM) 30-600 MG per 12 hr tablet 1 tablet  1 tablet Oral BID PRN Janice Norrie, MD      .  dicyclomine (BENTYL) tablet 20 mg  20 mg Oral Q6H PRN Janice Norrie, MD      . hydrOXYzine (ATARAX/VISTARIL) tablet 50 mg  50 mg Oral Q6H PRN Janice Norrie, MD   50 mg at 02/28/14 2114  . loperamide (IMODIUM) capsule 2-4 mg  2-4 mg Oral PRN Janice Norrie, MD      . methocarbamol (ROBAXIN) tablet 1,000 mg  1,000 mg Oral Q6H PRN Janice Norrie, MD      . naproxen (NAPROSYN) tablet 500 mg  500 mg Oral BID PRN Janice Norrie, MD      . nicotine (NICODERM CQ - dosed in mg/24 hours) patch 21 mg  21 mg Transdermal Daily Janice Norrie, MD   21 mg at 03/01/14 0934  . ondansetron (ZOFRAN-ODT) disintegrating tablet 4 mg  4 mg Oral Q6H PRN Janice Norrie, MD       No current outpatient prescriptions on file.    Psychiatric Specialty Exam:     Blood pressure 124/48, pulse 64, temperature 97 F (36.1 C), temperature source Oral, resp. rate 18, SpO2 97.00%.There is no height or weight on file to calculate BMI.  General Appearance: Disheveled  Eye Contact::  Minimal  Speech:  Clear and Coherent and Slow  Volume:  Decreased  Mood:  Anxious  Affect:  Constricted  Thought Process:  Goal Directed  Orientation:  Full (Time, Place, and Person)  Thought Content:  Negative  Suicidal Thoughts:  No  Homicidal Thoughts:  No  Memory:  Immediate;   Fair Recent;   Fair Remote;   Fair  Judgement:  Impaired  Insight:  Shallow  Psychomotor Activity:  Decreased  Concentration:  Fair  Recall:  [employer] of Knowledge:Good  Language: Good  Akathisia:  No  Handed:  Right  AIMS (if indicated):     Assets:  Communication Skills Desire for Improvement Physical Health  Sleep:   poor   Musculoskeletal: Strength & Muscle Tone: within normal limits Gait & Station: normal Patient leans: N/A  Treatment Plan Summary: Daily contact with patient to assess and evaluate symptoms and progress in treatment Medication management patient refer to [facility] for drug rehabilitation  Corena Pilgrim, MD 03/01/2014 12:14 PM

## 2014-03-01 NOTE — Progress Notes (Signed)
Patient requested to be discharged.  Per Nanine Means, NP patient will be discharged.  Writer provided the patient with referrals.

## 2014-03-01 NOTE — [facility] Assessment (Signed)
[facility] Assessment Progress Note     Pt's referral faxed to [facility] for review, as beds available and per Dr. Jannifer Franklin and Nanine Means, NP, pt currently denies SI/HI or psychosis and requesting detox.  TTS will need to follow up on referral.  Casimer Lanius, MS, [facility] Licensed Professional Counselor Triage Specialist

## 2014-03-01 NOTE — [facility] Suicide Risk Assessment (Signed)
Suicide Risk Assessment  Discharge Assessment     Demographic Factors:  Male and Caucasian  Total Time spent with patient: 20 minutes  Psychiatric Specialty Exam:      Blood pressure 124/48, pulse 64, temperature 97 F (36.1 C), temperature source Oral, resp. rate 18, SpO2 97.00%.There is no height or weight on file to calculate BMI.   General Appearance: Disheveled   Eye Contact:: Minimal   Speech: Clear and Coherent and Slow   Volume: Decreased   Mood: Anxious   Affect: Constricted   Thought Process: Goal Directed   Orientation: Full (Time, Place, and Person)   Thought Content: Negative   Suicidal Thoughts: No   Homicidal Thoughts: No   Memory: Immediate; Fair  Recent; Fair  Remote; Fair   Judgement: Impaired   Insight: Shallow   Psychomotor Activity: Decreased   Concentration: Fair   Recall: [employer] of Knowledge:Good   Language: Good   Akathisia: No   Handed: Right   AIMS (if indicated):   Assets: Communication Skills  Desire for Improvement  Physical Health   Sleep: poor   Musculoskeletal:  Strength & Muscle Tone: within normal limits  Gait & Station: normal  Patient leans: N/A   Mental Status Per Nursing Assessment::   On Admission:   heroin abuse/dependency  Current Mental Status by Physician: NA  Loss Factors: NA  Historical Factors: NA  Risk Reduction Factors:   Sense of responsibility to family, Living with another person, especially a relative and Positive social support  Continued Clinical Symptoms:  Substance abuse  Cognitive Features That Contribute To Risk:  None   Suicide Risk:  Minimal: No identifiable suicidal ideation.  Patients presenting with no risk factors but with morbid ruminations; may be classified as minimal risk based on the severity of the depressive symptoms  Discharge Diagnoses:   AXIS I:  Substance Abuse and Substance Induced Mood Disorder AXIS II:  Deferred AXIS III:   Past Medical History  Diagnosis  Date  . Heroin abuse    AXIS IV:  other psychosocial or environmental problems and problems related to social environment AXIS V:  61-70 mild symptoms  Plan Of Care/Follow-up recommendations:  Activity:  as tolerated Diet:  low-sodium heart healthy diet  Is patient on multiple antipsychotic therapies at discharge:  No   Has Patient had three or more failed trials of antipsychotic monotherapy by history:  No  Recommended Plan for Multiple Antipsychotic Therapies: NA    LORD, JAMISON, PMH-NP 03/01/2014, 4:42 PM

## 2014-03-01 NOTE — ED Notes (Signed)
Pt urinated in corner of room and in drinking cups in room.  Comfort measures given.

## 2014-03-01 NOTE — Progress Notes (Signed)
Patient was declined by [facility] due to upcoming court date.

## 2014-03-01 NOTE — Progress Notes (Signed)
  CARE MANAGEMENT ED NOTE 03/01/2014  Patient:  Samuel Weber, Samuel Weber   Account Number:  [id number]  Date Initiated:  03/01/2014  Documentation initiated by:  Edd Arbour  Subjective/Objective Assessment:   28 yr old male self pay [place] pt wanting detox from heroin (has been using for 2 years) Dx Opioid use disorder, Moderate     Subjective/Objective Assessment Detail:   no pcp as confirmed by pt  States he has not been to a "provider in a while" Denies a family doctor, counselor nor psychiatrist     Action/Plan:   ED  CM spoke with pt and provided self pay resources for pcps, family services of the [place], [place] see note below   Action/Plan Detail:   Anticipated DC Date:       Status Recommendation to Physician:   Result of Recommendation:    Other ED Services  Consult Working Psychologist, educational  Other  Outpatient Services - Pt will follow up  PCP issues    Choice offered to / List presented to:            Status of service:  Completed, signed off  ED Comments:   ED Comments Detail:  CM spoke with pt who confirms self pay [facility] resident with no pcp. CM discussed and provided written information for self pay pcps, importance of pcp for f/u care, www.needymeds.org, discounted pharmacies and other [employer] such as [employer], [employer], affordable care act,  financial assistance, DSS and  health department Reviewed resources for [employer] self pay pcps like Jovita Kussmaul, family medicine at [address], [facility] family practice, general medical clinics, [facility] urgent care plus others, medication resources, [facility] out patient pharmacies and housing Pt voiced understanding and appreciation of resources provided

## 2014-03-01 NOTE — ED Notes (Signed)
Patient came to the desk stating he wants to sign himself out.  Discussed with Nanine Means who states that he can be discharged with resources.  A list of treatment centers and mental health facilities was provided to patient.

## 2014-03-01 NOTE — BH Assessment (Signed)
Per Binnie Rail, [facility], adult unit is currently at capacity. Contacted the following facilities for placement:  CURRENTLY AT CAPACITY: [facility], per [facility], per Earl Lites, per [facility], per [facility], per Glorious Pe[facility], per [facility], per [facility], Per [facility], per [facility], per [facility], per [facility], per [facility], per [facility], per [facility], per [facility], per Claudine   Harlin Rain Patsy [place], [facility], [facility] Triage Specialist [phone]

## 2014-03-01 NOTE — Progress Notes (Signed)
Patient was referred to [facility], per Tiana they have open beds.

## 2014-03-01 NOTE — Progress Notes (Signed)
Patient wanted to leave and seek outpatient treatment, resources given.

## 2014-03-01 NOTE — ED Notes (Signed)
Patient discharged to home with instructions and a list of resources.  All belongings returned and signed for.

## 2014-07-08 ENCOUNTER — Encounter (HOSPITAL_COMMUNITY): Payer: Self-pay

## 2014-07-08 ENCOUNTER — Inpatient Hospital Stay (HOSPITAL_COMMUNITY)
Admission: EM | Admit: 2014-07-08 | Discharge: 2014-07-09 | DRG: 917 | Disposition: A | Discharge status: Home / Self Care | Payer: Self-pay | Attending: Internal Medicine | Admitting: Internal Medicine

## 2014-07-08 ENCOUNTER — Inpatient Hospital Stay (HOSPITAL_COMMUNITY): Payer: Self-pay

## 2014-07-08 ENCOUNTER — Emergency Department (HOSPITAL_COMMUNITY): Payer: Self-pay

## 2014-07-08 DIAGNOSIS — F1210 Cannabis abuse, uncomplicated: Secondary | ICD-10-CM | POA: Diagnosis present

## 2014-07-08 DIAGNOSIS — T1490XA Injury, unspecified, initial encounter: Secondary | ICD-10-CM

## 2014-07-08 DIAGNOSIS — R40243 Glasgow coma scale score 3-8: Secondary | ICD-10-CM

## 2014-07-08 DIAGNOSIS — E871 Hypo-osmolality and hyponatremia: Secondary | ICD-10-CM | POA: Diagnosis present

## 2014-07-08 DIAGNOSIS — R739 Hyperglycemia, unspecified: Secondary | ICD-10-CM | POA: Diagnosis present

## 2014-07-08 DIAGNOSIS — J9600 Acute respiratory failure, unspecified whether with hypoxia or hypercapnia: Secondary | ICD-10-CM

## 2014-07-08 DIAGNOSIS — R4182 Altered mental status, unspecified: Secondary | ICD-10-CM

## 2014-07-08 DIAGNOSIS — T50901A Poisoning by unspecified drugs, medicaments and biological substances, accidental (unintentional), initial encounter: Secondary | ICD-10-CM

## 2014-07-08 DIAGNOSIS — E876 Hypokalemia: Secondary | ICD-10-CM | POA: Diagnosis present

## 2014-07-08 DIAGNOSIS — F1910 Other psychoactive substance abuse, uncomplicated: Secondary | ICD-10-CM | POA: Diagnosis present

## 2014-07-08 DIAGNOSIS — R4020 Unspecified coma: Secondary | ICD-10-CM

## 2014-07-08 DIAGNOSIS — Z609 Problem related to social environment, unspecified: Secondary | ICD-10-CM | POA: Diagnosis present

## 2014-07-08 DIAGNOSIS — F1310 Sedative, hypnotic or anxiolytic abuse, uncomplicated: Secondary | ICD-10-CM

## 2014-07-08 DIAGNOSIS — R000 Tachycardia, unspecified: Secondary | ICD-10-CM | POA: Diagnosis present

## 2014-07-08 DIAGNOSIS — Z978 Presence of other specified devices: Secondary | ICD-10-CM

## 2014-07-08 DIAGNOSIS — R109 Unspecified abdominal pain: Secondary | ICD-10-CM

## 2014-07-08 DIAGNOSIS — F17210 Nicotine dependence, cigarettes, uncomplicated: Secondary | ICD-10-CM | POA: Diagnosis present

## 2014-07-08 DIAGNOSIS — T424X1A Poisoning by benzodiazepines, accidental (unintentional), initial encounter: Principal | ICD-10-CM | POA: Diagnosis present

## 2014-07-08 DIAGNOSIS — T402X1A Poisoning by other opioids, accidental (unintentional), initial encounter: Secondary | ICD-10-CM

## 2014-07-08 DIAGNOSIS — Z599 Problem related to housing and economic circumstances, unspecified: Secondary | ICD-10-CM

## 2014-07-08 HISTORY — DX: Poisoning by unspecified drugs, medicaments and biological substances, accidental (unintentional), initial encounter: T50.901A

## 2014-07-08 LAB — CBC WITH DIFFERENTIAL/PLATELET
BASOS ABS: 0 10*3/uL (ref 0.0–0.1)
Basophils Relative: 1 % (ref 0–1)
EOS ABS: 0.1 10*3/uL (ref 0.0–0.7)
EOS PCT: 2 % (ref 0–5)
HCT: 48.9 % (ref 39.0–52.0)
HEMOGLOBIN: 16.5 g/dL (ref 13.0–17.0)
LYMPHS ABS: 1.8 10*3/uL (ref 0.7–4.0)
LYMPHS PCT: 30 % (ref 12–46)
MCH: 34.4 pg — ABNORMAL HIGH (ref 26.0–34.0)
MCHC: 33.7 g/dL (ref 30.0–36.0)
MCV: 102.1 fL — ABNORMAL HIGH (ref 78.0–100.0)
MONOS PCT: 13 % — AB (ref 3–12)
Monocytes Absolute: 0.8 10*3/uL (ref 0.1–1.0)
NEUTROS ABS: 3.2 10*3/uL (ref 1.7–7.7)
Neutrophils Relative %: 54 % (ref 43–77)
Platelets: 218 10*3/uL (ref 150–400)
RBC: 4.79 MIL/uL (ref 4.22–5.81)
RDW: 12.9 % (ref 11.5–15.5)
WBC: 5.8 10*3/uL (ref 4.0–10.5)

## 2014-07-08 LAB — BLOOD GAS, ARTERIAL
Acid-base deficit: 1.5 mmol/L (ref 0.0–2.0)
Acid-base deficit: 1.9 mmol/L (ref 0.0–2.0)
Bicarbonate: 22.3 mEq/L (ref 20.0–24.0)
Bicarbonate: 21.3 mEq/L (ref 20.0–24.0)
Drawn by: 103701
Drawn by: 307971
FIO2: 0.21 %
FIO2: 1 %
MECHVT: 500 mL
O2 SAT: 97.4 %
O2 Saturation: 99.4 %
PATIENT TEMPERATURE: 98.6
PCO2 ART: 36.8 mmHg (ref 35.0–45.0)
PEEP/CPAP: 5 cmH2O
Patient temperature: 98.6
RATE: 16 resp/min
TCO2: 19 mmol/L (ref 0–100)
TCO2: 18.1 mmol/L (ref 0–100)
pCO2 arterial: 33.5 mmHg — ABNORMAL LOW (ref 35.0–45.0)
pH, Arterial: 7.4 (ref 7.350–7.450)
pH, Arterial: 7.419 (ref 7.350–7.450)
pO2, Arterial: 90.2 mmHg (ref 80.0–100.0)
pO2, Arterial: 527 mmHg — ABNORMAL HIGH (ref 80.0–100.0)

## 2014-07-08 LAB — I-STAT CHEM 8, ED
BUN: 9 mg/dL (ref 6–23)
CALCIUM ION: 1.09 mmol/L — AB (ref 1.12–1.23)
CREATININE: 0.9 mg/dL (ref 0.50–1.35)
Chloride: 105 mEq/L (ref 96–112)
Glucose, Bld: 112 mg/dL — ABNORMAL HIGH (ref 70–99)
HCT: 52 % (ref 39.0–52.0)
HEMOGLOBIN: 17.7 g/dL — AB (ref 13.0–17.0)
Potassium: 3.9 mmol/L (ref 3.5–5.1)
SODIUM: 141 mmol/L (ref 135–145)
TCO2: 23 mmol/L (ref 0–100)

## 2014-07-08 LAB — COMPREHENSIVE METABOLIC PANEL
ALT: 246 U/L — ABNORMAL HIGH (ref 0–53)
AST: 126 U/L — ABNORMAL HIGH (ref 0–37)
Albumin: 4.1 g/dL (ref 3.5–5.2)
Alkaline Phosphatase: 75 U/L (ref 39–117)
Anion gap: 5 (ref 5–15)
BUN: 9 mg/dL (ref 6–23)
CO2: 22 mmol/L (ref 19–32)
CREATININE: 0.89 mg/dL (ref 0.50–1.35)
Calcium: 8 mg/dL — ABNORMAL LOW (ref 8.4–10.5)
Chloride: 106 mEq/L (ref 96–112)
GFR calc Af Amer: >90 mL/min (ref >90)
Glucose, Bld: 110 mg/dL — ABNORMAL HIGH (ref 70–99)
Potassium: 3.4 mmol/L — ABNORMAL LOW (ref 3.5–5.1)
Sodium: 133 mmol/L — ABNORMAL LOW (ref 135–145)
TOTAL PROTEIN: 6.7 g/dL (ref 6.0–8.3)
Total Bilirubin: 1.6 mg/dL — ABNORMAL HIGH (ref 0.3–1.2)

## 2014-07-08 LAB — RAPID URINE DRUG SCREEN, HOSP PERFORMED
Amphetamines: NOT DETECTED
Barbiturates: NOT DETECTED
Benzodiazepines: POSITIVE — AB
Cocaine: NOT DETECTED
Opiates: POSITIVE — AB
Tetrahydrocannabinol: NOT DETECTED

## 2014-07-08 LAB — BLOOD GAS, VENOUS
Acid-base deficit: 1.5 mmol/L (ref 0.0–2.0)
BICARBONATE: 24.9 meq/L — AB (ref 20.0–24.0)
Drawn by: 305581
FIO2: 0.21 %
O2 Saturation: 56.1 %
PCO2 VEN: 50 mmHg (ref 45.0–50.0)
Patient temperature: 98.6
TCO2: 21.9 mmol/L (ref 0–100)
pH, Ven: 7.318 — ABNORMAL HIGH (ref 7.250–7.300)

## 2014-07-08 LAB — ACETAMINOPHEN LEVEL

## 2014-07-08 LAB — ETHANOL

## 2014-07-08 LAB — I-STAT TROPONIN, ED: TROPONIN I, POC: 0 ng/mL (ref 0.00–0.08)

## 2014-07-08 LAB — GLUCOSE, CAPILLARY
Glucose-Capillary: 73 mg/dL (ref 70–99)
Glucose-Capillary: 64 mg/dL — ABNORMAL LOW (ref 70–99)

## 2014-07-08 LAB — CBG MONITORING, ED
GLUCOSE-CAPILLARY: 89 mg/dL (ref 70–99)
GLUCOSE-CAPILLARY: 106 mg/dL — AB (ref 70–99)

## 2014-07-08 LAB — I-STAT CG4 LACTIC ACID, ED: LACTIC ACID, VENOUS: 1.21 mmol/L (ref 0.5–2.2)

## 2014-07-08 LAB — SALICYLATE LEVEL: Salicylate Lvl: <4 mg/dL (ref 2.8–20.0)

## 2014-07-08 LAB — TRIGLYCERIDES: Triglycerides: 81 mg/dL (ref <150)

## 2014-07-08 LAB — MRSA PCR SCREENING: MRSA by PCR: POSITIVE — AB

## 2014-07-08 MED ORDER — SODIUM CHLORIDE 0.9 % IV SOLN
INTRAVENOUS | Status: DC
  Administered 2014-07-08: 13:00:00 via INTRAVENOUS

## 2014-07-08 MED ORDER — PANTOPRAZOLE SODIUM 40 MG IV SOLR
40.0000 mg | INTRAVENOUS | Status: DC
  Administered 2014-07-08: 40 mg via INTRAVENOUS
  Filled 2014-07-08 (×2): qty 40

## 2014-07-08 MED ORDER — LIDOCAINE HCL (CARDIAC) 20 MG/ML IV SOLN
INTRAVENOUS | Status: AC
Start: 2014-07-08 — End: 2014-07-08
  Filled 2014-07-08: qty 5

## 2014-07-08 MED ORDER — PROPOFOL 10 MG/ML IV EMUL
0.0000 ug/kg/min | INTRAVENOUS | Status: DC
  Administered 2014-07-08: 25 ug/kg/min via INTRAVENOUS
  Administered 2014-07-08: 40 ug/kg/min via INTRAVENOUS
  Administered 2014-07-09: 30 ug/kg/min via INTRAVENOUS
  Filled 2014-07-08 (×2): qty 100

## 2014-07-08 MED ORDER — MIDAZOLAM HCL 2 MG/2ML IJ SOLN
5.0000 mg | Freq: Once | INTRAMUSCULAR | Status: AC
  Administered 2014-07-08: 5 mg via INTRAVENOUS

## 2014-07-08 MED ORDER — ETOMIDATE 2 MG/ML IV SOLN
INTRAVENOUS | Status: AC
  Administered 2014-07-08: 20 mg
  Filled 2014-07-08: qty 20

## 2014-07-08 MED ORDER — ROCURONIUM BROMIDE 50 MG/5ML IV SOLN
INTRAVENOUS | Status: AC
  Filled 2014-07-08: qty 2

## 2014-07-08 MED ORDER — MUPIROCIN 2 % EX OINT
1.0000 "application " | TOPICAL_OINTMENT | Freq: Two times a day (BID) | CUTANEOUS | Status: DC
  Administered 2014-07-08 – 2014-07-09 (×2): 1 via NASAL
  Filled 2014-07-08: qty 22

## 2014-07-08 MED ORDER — CHLORHEXIDINE GLUCONATE 0.12 % MT SOLN
15.0000 mL | Freq: Two times a day (BID) | OROMUCOSAL | Status: DC
  Administered 2014-07-08 – 2014-07-09 (×2): 15 mL via OROMUCOSAL
  Filled 2014-07-08 (×2): qty 15

## 2014-07-08 MED ORDER — DEXTROSE 5 % IV SOLN: 1.0000 mg/h | INTRAVENOUS | Status: DC

## 2014-07-08 MED ORDER — PROPOFOL 10 MG/ML IV EMUL
5.0000 ug/kg/min | Freq: Once | INTRAVENOUS | Status: AC
  Administered 2014-07-08: 5 ug/kg/min via INTRAVENOUS
  Filled 2014-07-08: qty 100

## 2014-07-08 MED ORDER — SODIUM CHLORIDE 0.9 % IV BOLUS (SEPSIS)
1000.0000 mL | Freq: Once | INTRAVENOUS | Status: AC
  Administered 2014-07-08: 1000 mL via INTRAVENOUS

## 2014-07-08 MED ORDER — CHLORHEXIDINE GLUCONATE CLOTH 2 % EX PADS
6.0000 | MEDICATED_PAD | Freq: Every day | CUTANEOUS | Status: DC
  Administered 2014-07-09: 6 via TOPICAL

## 2014-07-08 MED ORDER — NALOXONE HCL 1 MG/ML IJ SOLN
2.0000 mg | Freq: Once | INTRAMUSCULAR | Status: AC
  Administered 2014-07-08: 2 mg via INTRAVENOUS
  Filled 2014-07-08: qty 2

## 2014-07-08 MED ORDER — FENTANYL CITRATE 0.05 MG/ML IJ SOLN
100.0000 ug | INTRAMUSCULAR | Status: DC | PRN
  Administered 2014-07-08 – 2014-07-09 (×3): 100 ug via INTRAVENOUS
  Filled 2014-07-08 (×3): qty 2

## 2014-07-08 MED ORDER — MIDAZOLAM HCL 2 MG/2ML IJ SOLN
INTRAMUSCULAR | Status: AC
  Administered 2014-07-08: 5 mg via INTRAVENOUS
  Filled 2014-07-08: qty 6

## 2014-07-08 MED ORDER — CETYLPYRIDINIUM CHLORIDE 0.05 % MT LIQD
7.0000 mL | Freq: Four times a day (QID) | OROMUCOSAL | Status: DC
  Administered 2014-07-08 – 2014-07-09 (×3): 7 mL via OROMUCOSAL

## 2014-07-08 MED ORDER — NALOXONE HCL 1 MG/ML IJ SOLN
1.0000 mg | Freq: Once | INTRAMUSCULAR | Status: AC
  Administered 2014-07-08: 1 mg via INTRAVENOUS
  Filled 2014-07-08: qty 2

## 2014-07-08 MED ORDER — ONDANSETRON HCL 4 MG/2ML IJ SOLN: 4.0000 mg | Freq: Once | INTRAMUSCULAR | Status: DC

## 2014-07-08 MED ORDER — DEXTROSE-NACL 5-0.9 % IV SOLN
INTRAVENOUS | Status: DC
  Administered 2014-07-08: 19:00:00 via INTRAVENOUS

## 2014-07-08 MED ORDER — SODIUM CHLORIDE 0.9 % IV SOLN: 250.0000 mL | INTRAVENOUS | Status: DC | PRN

## 2014-07-08 MED ORDER — SUCCINYLCHOLINE CHLORIDE 20 MG/ML IJ SOLN
INTRAMUSCULAR | Status: AC
  Filled 2014-07-08: qty 1

## 2014-07-08 MED ORDER — HEPARIN SODIUM (PORCINE) 5000 UNIT/ML IJ SOLN
5000.0000 [IU] | Freq: Three times a day (TID) | INTRAMUSCULAR | Status: DC
  Administered 2014-07-08 – 2014-07-09 (×3): 5000 [IU] via SUBCUTANEOUS
  Filled 2014-07-08 (×9): qty 1

## 2014-07-08 NOTE — Procedures (Signed)
Intubation Procedure Note Samuel BevelsMichael Q Weber [id number] 08/10/1985  Procedure: Intubation Indications: Airway protection and maintenance  Procedure Details Consent: Risks of procedure as well as the alternatives and risks of each were explained to the (patient/caregiver).  Consent for procedure obtained. Time Out: Verified patient identification, verified procedure, site/side was marked, verified correct patient position, special equipment/implants available, medications/allergies/relevent history reviewed, required imaging and test results available.  Performed  Maximum sterile technique was used including cap, gloves, gown, hand hygiene and mask.  MAC    Evaluation Hemodynamic Status: BP stable throughout; O2 sats: stable throughout Patient's Current Condition: stable Complications: No apparent complications Patient did tolerate procedure well. Chest X-ray ordered to verify placement.  CXR: pending.   Samuel Weber 07/08/2014  Easy intubation with glidescope. No aspiration or stomach contents in airway Poor oral hygiene - biotene used prior to intubation   Dr. Kalman ShanMurali Sahily Weber, M.D., [facility].C.C.P Pulmonary and Critical Care Medicine Staff Physician [facility]  Pulmonary and Critical Care Pager: [phone], If no answer or between  15:00h - 7:00h: call [phone]  07/08/2014 11:12 AM

## 2014-07-08 NOTE — Progress Notes (Signed)
eLink Physician-Brief Progress Note Patient Name: Samuel Weber DOB: 03/04/1986 MRN: [id number]   Date of Service  07/08/2014  HPI/Events of Note  Low cbgs on ns lowest 60 no symptoms. No rx  eICU Interventions  Change to d5ns same rate      Intervention Category Intermediate Interventions: Electrolyte abnormality - evaluation and management  Sandrea HughsMichael Zianne Schubring 07/08/2014, 6:44 PM

## 2014-07-08 NOTE — ED Provider Notes (Signed)
CSN: [id number]     Arrival date & time 07/08/14  4098 History   First MD Initiated Contact with Patient 07/08/14 0344     Chief Complaint  Patient presents with  . Drug Overdose    (Consider location/radiation/quality/duration/timing/severity/associated sxs/prior Treatment) HPI Comments: Level V caveat applies secondary to acuity of condition and altered mental status.  Patient is a 29 year old male with a known history of heroin abuse who presents to the emergency department for suspected heroin overdose. Patient was dropped off at the fire station at approximately 0230 by an unknown male. Per EMS, patient was unconscious at this time with slow respirations, snoring. He was found with a spoon, needle, tourniquet, and plastic bag in his possession. EMS gave 1 mg of Narcan IV prior to arrival which improved patient's respirations. Patient was allegedly eating a sub sandwich which accounts for the food noted in his teeth and on his lips. EMS denies N/V prior to arrival.  Fiance later presented to the ED. States that patient was in her car when he started to decline while eating a hot dog. She states that she stuck her fingers down his throat in case he was choking, but he never experienced any coughing or evidence of choking prior to sudden onset of sedation. She dropped him off at the fire station because she didn't know where else to take him.  Patient is a 29 y.o. male presenting with Overdose. The history is provided by the EMS personnel. No language interpreter was used.  Drug Overdose    Past Medical History  Diagnosis Date  . Heroin abuse    Past Surgical History  Procedure Laterality Date  . Appendectomy     History reviewed. No pertinent family history. History  Substance Use Topics  . Smoking status: Current Every Day Smoker  . Smokeless tobacco: Not on file  . Alcohol Use: No    Review of Systems  Unable to perform ROS: Mental status change    Allergies  Review of  patient's allergies indicates no known allergies.  Home Medications   Prior to Admission medications   Medication Sig Start Date End Date Taking? Authorizing Provider  acetaminophen (TYLENOL) 500 MG tablet Take 1,000 mg by mouth every 6 (six) hours as needed for mild pain.   Yes Historical Provider, MD   BP 132/81 mmHg  Pulse 91  Resp 12  SpO2 98%   Physical Exam  Constitutional: He appears well-developed and well-nourished.  HENT:  Head: Normocephalic and atraumatic.  Eyes: Conjunctivae and EOM are normal. No scleral icterus.  Pupils 2mm, sluggish  Neck: Normal range of motion.  Cardiovascular: Regular rhythm and normal heart sounds.  Tachycardia present.   Pulmonary/Chest: Effort normal and breath sounds normal. No respiratory distress. He has no wheezes.  Respirations even and unlabored. Lungs CTAB  Abdominal: He exhibits no distension.  Neurological: GCS eye subscore is 1. GCS verbal subscore is 3. GCS motor subscore is 4.  GCS 8 on arrival  Skin: Skin is warm and dry. No rash noted. No erythema.  Psychiatric: He has a normal mood and affect. His behavior is normal.  Nursing note and vitals reviewed.   ED Course  Procedures (including critical care time) Labs Review Labs Reviewed  CBC WITH DIFFERENTIAL - Abnormal; Notable for the following:    MCV 102.1 (*)    MCH 34.4 (*)    Monocytes Relative 13 (*)    All other components within normal limits  COMPREHENSIVE METABOLIC PANEL - Abnormal;  Notable for the following:    Sodium 133 (*)    Potassium 3.4 (*)    Glucose, Bld 110 (*)    Calcium 8.0 (*)    AST 126 (*)    ALT 246 (*)    Total Bilirubin 1.6 (*)    All other components within normal limits  URINE RAPID DRUG SCREEN (HOSP PERFORMED) - Abnormal; Notable for the following:    Opiates POSITIVE (*)    Benzodiazepines POSITIVE (*)    All other components within normal limits  ACETAMINOPHEN LEVEL - Abnormal; Notable for the following:    Acetaminophen (Tylenol),  Serum <10.0 (*)    All other components within normal limits  BLOOD GAS, VENOUS - Abnormal; Notable for the following:    pH, Ven 7.318 (*)    Bicarbonate 24.9 (*)    All other components within normal limits  I-STAT CHEM 8, ED - Abnormal; Notable for the following:    Glucose, Bld 112 (*)    Calcium, Ion 1.09 (*)    Hemoglobin 17.7 (*)    All other components within normal limits  SALICYLATE LEVEL  ETHANOL  I-STAT CG4 LACTIC ACID, ED  Rosezena SensorI-STAT TROPOININ, ED    Imaging Review Dg Chest Port 1 View  07/08/2014   CLINICAL DATA:  Overdose.  Unresponsive.  Initial encounter.  EXAM: PORTABLE CHEST - 1 VIEW  COMPARISON:  Chest radiograph performed 02/28/2014  FINDINGS: The lungs are well-aerated and clear. There is no evidence of focal opacification, pleural effusion or pneumothorax.  The cardiomediastinal silhouette is within normal limits. No acute osseous abnormalities are seen.  IMPRESSION: No acute cardiopulmonary process seen.   Electronically Signed   By: Roanna RaiderJeffery  Chang M.D.   On: 07/08/2014 04:15     EKG Interpretation   Date/Time:  Thursday July 08 2014 04:02:08 EST Ventricular Rate:  117 PR Interval:  150 QRS Duration: 92 QT Interval:  337 QTC Calculation: 470 R Axis:   92 Text Interpretation:  Sinus tachycardia Prominent P waves, nondiagnostic  Borderline right axis deviation Borderline prolonged QT interval agree.   Confirmed by Donnald GarrePfeiffer, MD, Lebron ConnersMarcy [phone]([id number]) on 07/08/2014 5:53:58 AM      MDM   Final diagnoses:  Opioid overdose  Altered mental status  Benzodiazepine abuse    29 year old male resents to the emergency department for further evaluation of altered mental status. UDS positive for benzodiazepines and opioids. Patient has a known history of heroin abuse. He was found with a needle, tourniquet, and metal spoon; presumed heroine use PTA. Patient does respond to Narcan in the sense that he becomes agitated, though he still appears sedated with slurred speech.  Suspect that this is secondary to benzodiazepine use. CT head ordered for further evaluation of symptoms. Labs otherwise are reassuring. He has a bump in his LFTs which will likely require recheck at primary doctor in an outpatient setting. Vitals are improving as well.  Given patient's persistent AMS despite tx with Narcan, believe he warrants further monitoring in the ED. Patient signed out to Dr. Rolland PorterMark James who will monitor and f/u on CT head results. Anticipate TTS evaluation when the patient is more awake and conversant.   Filed Vitals:   07/08/14 0515 07/08/14 0530 07/08/14 0626 07/08/14 0701  BP: 141/89 145/92 156/98 132/81  Pulse: 95 99 93 91  Resp: 15 20 14 12   SpO2: 93% 97% 98% 98%     Antony MaduraKelly Maybelle Depaoli, PA-C 07/08/14 [id number]  Arby BarretteMarcy Pfeiffer, MD 07/13/14 2210

## 2014-07-08 NOTE — ED Provider Notes (Addendum)
Pt's care assumed.  Pt examined.  Reviewed Hx with PA Humes. Pt given additional IV Narcan 2mg . No change. Continues with snoring respirations, but with adequate depth of respirations. sats 100% on RA. Repeat ABG c PCO2 36.8. GCS 8. Head CT with NAD. Continued ALOC likely2/2 benzodiazepines. Tox + Opiates and benzos.  Initial change with Narcan likely opiate response. Does have +Gag/+cough. Now starting to turn over in bed. Spontaneous moans/sounds.  D/W Critical care.  Pt does not need emergent intubation.  GCS 8 but improving.  Pt will be seen by CC in ER.  IF continuing to improve LOC and protect airway, will be admitted to ICU for close obs.    12:25:  Patient intubated by critical care for continued low GCS. Patient given Versed for sedation. Propofol bolus and drip given by me because of some agitation on ventilator. Restraint orders given to avoid withdrawing his tube due to his agitation and confusion.  CRITICAL CARE Performed by: Rolland PorterJAMES, Yuritza Paulhus JOSEPH   Total critical care time: 60 MInutes Start 11:30 Stop 12:30  Critical care time was exclusive of separately billable procedures and treating other patients.  Critical care was necessary to treat or prevent imminent or life-threatening deterioration.  Critical care was time spent personally by me on the following activities: development of treatment plan with patient and/or surrogate as well as nursing, discussions with consultants, evaluation of patient's response to treatment, examination of patient, obtaining history from patient or surrogate, ordering and performing treatments and interventions, ordering and review of laboratory studies, ordering and review of radiographic studies, pulse oximetry and re-evaluation of patient's condition.    Rolland PorterMark Raffi Milstein, MD 07/08/14 1020  Rolland PorterMark Dawana Asper, MD 07/08/14 1226

## 2014-07-08 NOTE — ED Notes (Signed)
Bed: RESA Expected date: 07/08/14 Expected time: 3:00 AM Means of arrival: Ambulance Comments: Overdose/lethargic/rr10

## 2014-07-08 NOTE — H&P (Signed)
PULMONARY / CRITICAL CARE MEDICINE   Name: Alessandra BevelsMichael Q Smelser MRN: [id number] DOB: 11/24/1985    ADMISSION DATE:  07/08/2014 CONSULTATION DATE:  07/08/13  REFERRING MD :  Dr. Fayrene FearingJames   CHIEF COMPLAINT:  Overdose / AMS   INITIAL PRESENTATION: 29 y/o M admitted 1/14 with opiate / benzo overdose.   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  EMS was activated and he was given 1 mg Narcan with some improvement in respiratory status.  He was escorted by the Sheriff's Dept to the ER.  In his possession, he had a spoon, needle, tourniquet and baggie.  He was apparently eating in the car with his fiancee when he began to have a decline in mental status.  She attempted to sweep his mouth for food but he did not respond and so she dropped him off at the fire station for help.    [facility] he was treated with approximately 4mg  of Narcan with modest change in mental status to the point of waking up with a few words but would fall back to sleep.  Mother / Father at bedside and they report he uses heroin but are unclear as to how much or how often.  He lives with his parents and does not work.  PCCM consulted for evaluation.      PAST MEDICAL HISTORY :   has a past medical history of Heroin abuse.  has past surgical history that includes Appendectomy.    HOME MEDICATIONS: Prior to Admission medications   Medication Sig Start Date End Date Taking? Authorizing Provider  acetaminophen (TYLENOL) 500 MG tablet Take 1,000 mg by mouth every 6 (six) hours as needed for mild pain.   Yes Historical Provider, MD   No Known Allergies  FAMILY HISTORY:  has no family  status information on file.    SOCIAL HISTORY:  reports that he has been smoking.  He does not have any smokeless tobacco history on file. He reports that he uses illicit drugs (IV, Cocaine, and Marijuana). He reports that he does not drink alcohol.  REVIEW OF SYSTEMS:  Unable to complete as patient is altered.  Information obtained from family at bedside.   SUBJECTIVE:   VITAL SIGNS: Pulse Rate:  [88-108] 97 (01/14 1000) Resp:  [11-20] 20 (01/14 1000) BP: (120-166)/(74-99) 138/78 mmHg (01/14 1000) SpO2:  [91 %-100 %] 93 % (01/14 1000)   HEMODYNAMICS:     VENTILATOR SETTINGS:     INTAKE / OUTPUT: No intake or output data in the 24 hours ending 07/08/14 1009  PHYSICAL EXAMINATION: General:  Thin adult male in NAD Neuro:  Lethargic, no response to verbal / physical stimuli, no gag HEENT:  Poor dentition, no jvd Cardiovascular:  s1s2 rrr, no m/r/g  Lungs:  resp's shallow, non-labored, lungs bilaterally clear  Abdomen:  Non-distended, bsx4 hypoactive  Musculoskeletal:  No acute deformities  Skin:  Few track marks at AC's, bruising on hands.  No others identified (including neck / between toes)  LABS:  CBC  Recent Labs Lab 07/08/14 0452 07/08/14 0504  WBC 5.8  --  HGB 16.5 17.7*  HCT 48.9 52.0  PLT 218  --    Coag's No results for input(s): APTT, INR in the last 168 hours. BMET  Recent Labs Lab 07/08/14 0452 07/08/14 0504  NA 133* 141  K 3.4* 3.9  CL 106 105  CO2 22  --   BUN 9 9  CREATININE 0.89 0.90  GLUCOSE 110* 112*   Electrolytes  Recent Labs Lab 07/08/14 0452  CALCIUM 8.0*   Sepsis Markers  Recent Labs Lab 07/08/14 0512  LATICACIDVEN 1.21   ABG  Recent Labs Lab 07/08/14 0844  PHART 7.400  PCO2ART 36.8  PO2ART 90.2   Liver Enzymes  Recent Labs Lab 07/08/14 0452  AST 126*  ALT 246*  ALKPHOS 75  BILITOT 1.6*  ALBUMIN 4.1   Cardiac Enzymes No results for input(s): TROPONINI, PROBNP in the last 168 hours.   Glucose No  results for input(s): GLUCAP in the last 168 hours.  Imaging No results found.   ASSESSMENT / PLAN:  NEUROLOGIC A:   Acute Polysubstance Overdose - UDS positive for benzo's + opiates.  CT head negative.  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, most compatible w choroidal fissure cyst P:   RASS goal: 0 Intubation for airway protection  Will need PSY consult once extubated to eval for substance abuse  Allow to wake, if becomes agitated, consider propofol for sedation as he will likely be quickly extubated   PULMONARY OETT 1/14 >>  A: At Risk Aspiration / Poor Airway Protection - initial CXR without infiltrate Acute Respiratory Failure - in setting of benzo / opiate overdose  P:   Now intubation for airway protection  Vent support, 8 cc/kg F/u ABG 1 hour post intubation  CXR post intubation to eval for ETT placement   CARDIOVASCULAR CVL A:  Mild Tachycardia - resolved.  P:  ICU monitoring   RENAL A:   Hypokalemia  Hyponatremia  P:   NS @ 75 ml/hr KCL mEq x1 Trend BMP Replace electrolytes as indicated   GASTROINTESTINAL A:   No acute issues  P:   NPO  OGT  PPI for SUP   HEMATOLOGIC A:   No acute issues  P:  Heparin for DVT prophylaxis  Trend CBC   INFECTIOUS A:   At Risk Aspiration  P:   Monitor off abx, no evidence of acute infection   ENDOCRINE A:   Mild Hyperglycemia    P:   Monitor CBG while NPO   FAMILY  - Updates: Family updated extensively at bedside.      Canary Brim, NP-C [place] [facility] Pgr: [phone] or [phone]    07/08/2014, 10:09 AM

## 2014-07-08 NOTE — ED Notes (Signed)
Several unsuccessful attempts to draw labs from established IV site. Phlebotomy unsuccessful with lab draws. IV consult to be placed.

## 2014-07-08 NOTE — ED Notes (Signed)
A male dropped him off at the firestation and he was unconscious, ems gave 1 mg of narcan. Pt brought to ED escorted by sherriff, pt had a spoon, needle, tourniquet and a baggie in his position. Pt is arousable to stimuli but not answering questions.

## 2014-07-09 ENCOUNTER — Inpatient Hospital Stay (HOSPITAL_COMMUNITY): Payer: Self-pay

## 2014-07-09 DIAGNOSIS — F1994 Other psychoactive substance use, unspecified with psychoactive substance-induced mood disorder: Secondary | ICD-10-CM

## 2014-07-09 DIAGNOSIS — T50901D Poisoning by unspecified drugs, medicaments and biological substances, accidental (unintentional), subsequent encounter: Secondary | ICD-10-CM

## 2014-07-09 LAB — GLUCOSE, CAPILLARY
Glucose-Capillary: 102 mg/dL — ABNORMAL HIGH (ref 70–99)
Glucose-Capillary: 103 mg/dL — ABNORMAL HIGH (ref 70–99)
Glucose-Capillary: 95 mg/dL (ref 70–99)

## 2014-07-09 LAB — CBC
HCT: 48.4 % (ref 39.0–52.0)
Hemoglobin: 16.7 g/dL (ref 13.0–17.0)
MCH: 34.2 pg — ABNORMAL HIGH (ref 26.0–34.0)
MCHC: 34.5 g/dL (ref 30.0–36.0)
MCV: 99 fL (ref 78.0–100.0)
Platelets: 218 10*3/uL (ref 150–400)
RBC: 4.89 MIL/uL (ref 4.22–5.81)
RDW: 13.2 % (ref 11.5–15.5)
WBC: 8.3 10*3/uL (ref 4.0–10.5)

## 2014-07-09 LAB — BASIC METABOLIC PANEL
ANION GAP: 6 (ref 5–15)
BUN: 5 mg/dL — ABNORMAL LOW (ref 6–23)
CALCIUM: 8.5 mg/dL (ref 8.4–10.5)
CO2: 22 mmol/L (ref 19–32)
Chloride: 115 mEq/L — ABNORMAL HIGH (ref 96–112)
Creatinine, Ser: 0.95 mg/dL (ref 0.50–1.35)
GFR calc Af Amer: >90 mL/min (ref >90)
Glucose, Bld: 101 mg/dL — ABNORMAL HIGH (ref 70–99)
Potassium: 3.5 mmol/L (ref 3.5–5.1)
Sodium: 143 mmol/L (ref 135–145)

## 2014-07-09 LAB — MAGNESIUM: MAGNESIUM: 2 mg/dL (ref 1.5–2.5)

## 2014-07-09 LAB — HIV ANTIBODY (ROUTINE TESTING W REFLEX): HIV-1/HIV-2 Ab: NONREACTIVE

## 2014-07-09 MED ORDER — FOLIC ACID 5 MG/ML IJ SOLN
1.0000 mg | Freq: Every day | INTRAMUSCULAR | Status: DC
  Administered 2014-07-09: 1 mg via INTRAVENOUS
  Filled 2014-07-09: qty 0.2

## 2014-07-09 MED ORDER — FENTANYL CITRATE 0.05 MG/ML IJ SOLN: 25.0000 ug | INTRAMUSCULAR | Status: DC | PRN

## 2014-07-09 MED ORDER — DEXTROSE-NACL 5-0.45 % IV SOLN
INTRAVENOUS | Status: AC
  Administered 2014-07-09: 08:00:00 via INTRAVENOUS

## 2014-07-09 MED ORDER — THIAMINE HCL 100 MG/ML IJ SOLN
100.0000 mg | Freq: Every day | INTRAMUSCULAR | Status: DC
  Administered 2014-07-09: 100 mg via INTRAVENOUS
  Filled 2014-07-09: qty 1

## 2014-07-09 NOTE — Procedures (Signed)
Extubation Procedure Note  Patient Details:   Name: Samuel Weber DOB: 06/13/1986 MRN: [id number]   Airway Documentation:     Evaluation  O2 sats: stable throughout Complications: No apparent complications Patient did tolerate procedure well. Bilateral Breath Sounds: Clear, Diminished Suctioning: Oral, Airway Yes  Pt extubated per MD, placed on 4lpm [place]. Tolerated well.   Melanee Spryelson, Luca Burston Lawson 07/09/2014, 10:10 AM

## 2014-07-09 NOTE — Discharge Summary (Signed)
PCCM Discharge Summary  Name: Samuel Weber MRN: [id number] DOB: Feb 05, 1986 29 y.o. PCP: No primary care provider on file.  Date of Admission: 07/08/2014  3:38 AM Date of Discharge: 07/09/2014 Attending Physician: Kalman Shan, MD  Discharge Diagnosis: 1. Opiate and Benzo overdose Active Problems:   Overdose   Coma   Acute respiratory failure  Discharge Medications:   Medication List    TAKE these medications        acetaminophen 500 MG tablet  Commonly known as:  TYLENOL  Take 1,000 mg by mouth every 6 (six) hours as needed for mild pain.        Disposition and follow-up:   [name] was discharged from [facility] in [place] condition.  At the hospital follow up visit please address:  1. Please encourage him to seek treatment for polysubstance abuse.   2.  Labs / imaging needed at time of follow-up:   3.  Pending labs/ test needing follow-up:   Follow-up Appointments:   Discharge Instructions:   Consultations: Treatment Team:  Nehemiah Settle, MD  Procedures Performed:  Ct Head Wo Contrast  07/08/2014   CLINICAL DATA:  Altered mental status. Drug overdose. Heroin abuse.  EXAM: CT HEAD WITHOUT CONTRAST  TECHNIQUE: Contiguous axial images were obtained from the base of the skull through the vertex without intravenous contrast.  COMPARISON:  None available.  FINDINGS: A 17 mm cystic lesion is noted just above the choroidal fissure on the right. No acute cortical infarct, hemorrhage, or mass lesion is present. The ventricles are of normal size. No significant extra-axial fluid collection is present.  The paranasal sinuses and mastoid air cells are clear. Calvarium is intact. No significant extracranial soft tissue injury is evident. The globes and orbits are within normal limits.  IMPRESSION: 1. 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 2. Otherwise normal CT appearance of  the brain. No acute intracranial abnormality.   Electronically Signed   By: Gennette Pac M.D.   On: 07/08/2014 08:47   Dg Chest Port 1 View  07/09/2014   CLINICAL DATA:  29 year old male with drug overdose. Evaluate for developing infiltrate.  EXAM: PORTABLE CHEST - 1 VIEW  COMPARISON:  Chest x-ray 07/08/2014.  FINDINGS: An endotracheal tube is in place with tip 4.1 cm above the carina. A nasogastric tube is seen extending into the stomach, however, the tip of the nasogastric tube extends below the lower margin of the image. Lung volumes are low. No consolidative airspace disease. No pleural effusions. No pneumothorax. No pulmonary nodule or mass noted. Pulmonary vasculature and the cardiomediastinal silhouette are within normal limits.  IMPRESSION: 1. Support apparatus, as above. 2. Low lung volumes without radiographic evidence of acute cardiopulmonary disease.   Electronically Signed   By: Trudie Reed M.D.   On: 07/09/2014 07:32   Dg Chest Port 1 View  07/08/2014   CLINICAL DATA:  Endotracheal tube placement. Acute respiratory distress.  EXAM: PORTABLE CHEST - 1 VIEW  COMPARISON:  07/08/2014 at 0407 hr  FINDINGS: Endotracheal tube has been placed with tip projecting approximately 4 cm above the carina. Enteric tube courses into the left upper abdomen with tip not imaged. The cardiomediastinal silhouette is within normal limits. No airspace consolidation, edema, pleural effusion, or pneumothorax is identified.  IMPRESSION: Endotracheal tube in satisfactory position.  Clear lungs.   Electronically Signed   By: Sebastian Ache   On: 07/08/2014 11:59  Dg Chest Port 1 View  07/08/2014   CLINICAL DATA:  Overdose.  Unresponsive.  Initial encounter.  EXAM: PORTABLE CHEST - 1 VIEW  COMPARISON:  Chest radiograph performed 02/28/2014  FINDINGS: The lungs are well-aerated and clear. There is no evidence of focal opacification, pleural effusion or pneumothorax.  The cardiomediastinal silhouette is within normal  limits. No acute osseous abnormalities are seen.  IMPRESSION: No acute cardiopulmonary process seen.   Electronically Signed   By: Roanna Raider M.D.   On: 07/08/2014 04:15     Admission HPI:  29 y/o M with PMH of heroin / xanax / valium abuse and appendectomy who presented to [facility] ER on 1/14 am after being dropped off at a fire station unconscious. EMS was activated and he was given 1 mg Narcan with some improvement in respiratory status. He was escorted by the Sheriff's Dept to the ER. In his possession, he had a spoon, needle, tourniquet and baggie. He was apparently eating in the car with his fiancee when he began to have a decline in mental status. She attempted to sweep his mouth for food but he did not respond and so she dropped him off at the fire station for help.   On arrival to [facility] ER he was treated with approximately  of Narcan with modest change in mental status to the point of waking up with a few words but would fall back to sleep. Mother / Father at bedside and they report he uses heroin but are unclear as to how much or how often. He lives with his parents and does not work. PCCM consulted for evaluation.   Hospital Course by problem list: Active Problems:   Overdose   Coma   Acute respiratory failure   Acute resp failure - 2/2 to opiate and benzo overdose. Was intubated 07/08/14 on presentation with GCS score of 5. Did not have gag with several rounds of narcan. Vent support was provided then he was more awake 07/09/2014 responding to conversation and following commands. Decision was made to extubate him. He was on room air after extubation, able to tolerate diet, able to ambulate, and was doing significantly better.   Coma 2/2 to Polysubstance abuse/overdose - has hx of heroin, benzo, and THC abuse. This time he accidentally overdosed per himself and partner/girlfriend. UDS +opiate and benzo. CT head no acute finding. Psych was consulted for substance  abuse/overdose. He declined any treatment program currently. May benefit from residential substance abuse rehab treatment but patient is not agreeable. He has some tachycardia currently likely from withdrawal.  Discharge Vitals:   BP 126/68 mmHg  Pulse 120  Temp(Src) 97.8 F (36.6 C) (Oral)  Resp 22  Ht  (1.854 m)  Wt 74.4 kg (164 lb 0.4 oz)  BMI 21.64 kg/m2  SpO2 98%  Discharge Labs:  Results for orders placed or performed during the hospital encounter of 07/08/14 (from the past 24 hour(s))  Glucose, capillary     Status: Abnormal   Collection Time: 07/08/14  6:23 PM  Result Value Ref Range   Glucose-Capillary 64 (L) 70 - 99 mg/dL  Glucose, capillary     Status: Abnormal   Collection Time: 07/09/14 12:13 AM  Result Value Ref Range   Glucose-Capillary 102 (H) 70 - 99 mg/dL  CBC     Status: Abnormal   Collection Time: 07/09/14  2:54 AM  Result Value Ref Range   WBC 8.3 4.0 - 10.5 K/uL   RBC 4.89 4.22 -  5.81 MIL/uL   Hemoglobin 16.7 13.0 - 17.0 g/dL   HCT 16.148.4 09.639.0 - 04.552.0 %   MCV 99.0 78.0 - 100.0 fL   MCH 34.2 (H) 26.0 - 34.0 pg   MCHC 34.5 30.0 - 36.0 g/dL   RDW 40.913.2 81.111.5 - 91.415.5 %   Platelets 218 150 - 400 K/uL  Basic metabolic panel     Status: Abnormal   Collection Time: 07/09/14  2:54 AM  Result Value Ref Range   Sodium 143 135 - 145 mmol/L   Potassium 3.5 3.5 - 5.1 mmol/L   Chloride 115 (H) 96 - 112 mEq/L   CO2 22 19 - 32 mmol/L   Glucose, Bld 101 (H) 70 - 99 mg/dL   BUN 5 (L) 6 - 23 mg/dL   Creatinine, Ser 7.820.95 0.50 - 1.35 mg/dL   Calcium 8.5 8.4 - 95.610.5 mg/dL   GFR calc non Af Amer >90 >90 mL/min   GFR calc Af Amer >90 >90 mL/min   Anion gap 6 5 - 15  Magnesium     Status: None   Collection Time: 07/09/14  2:54 AM  Result Value Ref Range   Magnesium 2.0 1.5 - 2.5 mg/dL  Glucose, capillary     Status: None   Collection Time: 07/09/14  6:18 AM  Result Value Ref Range   Glucose-Capillary 95 70 - 99 mg/dL   Comment 1 Documented in Chart    Comment 2  Notify RN   Glucose, capillary     Status: Abnormal   Collection Time: 07/09/14 11:58 AM  Result Value Ref Range   Glucose-Capillary 103 (H) 70 - 99 mg/dL    Signed: Hyacinth Meekerasrif Ahmed, MD 07/09/2014, 3:08 PM    Services Ordered on Discharge:  Equipment Ordered on Discharge:    Billy Fischeravid Emmagene Ortner, MD ; [facility] Mobile [phone].  After 5:30 PM or weekends, call [phone]

## 2014-07-09 NOTE — Consult Note (Signed)
[facility] Face-to-Face Psychiatry Consult   Reason for Consult:  Polysubstance dependence and opioid and benzo's intoxication Referring Physician:  Dellia Nims, MD  Samuel Weber is an 29 y.o. male. Total Time spent with patient: 45 minutes  Assessment: AXIS I:  Substance Abuse and Substance Induced Mood Disorder AXIS II:  Cluster B Traits AXIS III:   Past Medical History  Diagnosis Date  . Heroin abuse    AXIS IV:  economic problems, housing problems, other psychosocial or environmental problems, problems related to legal system/crime, problems related to social environment and problems with primary support group AXIS V:  31-40 impairment in reality testing  Plan: Case discussed with Dellia Nims, MD  No evidence of imminent risk to self or others at present.   Patient does not meet criteria for psychiatric inpatient admission. Supportive therapy provided about ongoing stressors. Refer to IOP.  Subjective:   Samuel Weber is a 29 y.o. male patient admitted with opioid and benzo's intoxication.  HPI:   Samuel Weber is 29 year old male seen, chart reviewed and case discussed with Dellia Nims, MD. Patient parents were not at bedside at this time. Patient stated that he has been suffering with polysubstance abuse versus dependence over 2 years. Patient also reportedly being on probation and was served jail time from October to November 2015. Patient reported he took 10 pills of benzodiazepines and opiates to get high. Patient denied symptoms of depression, mania, anxiety, psychosis and suicidal or homicidal ideation. Patient reported he received at least 3 previous substance abuse treatments including at [facility]. Patient denied being placed. RTC for substance abuse rehabilitation after his visit in [place] [facility] emergency department in September 2015. Patient urine drug screen is positive for benzodiazepines and opiates. Patient has no withdrawal symptoms at this time.  Please  review the following information for further details. Patient states he's using 2 g of Heroine daily by "shooting up". He states his girlfriend gives him the heroin for free. Patient reports that he also uses cocaine and Marijuana occasionally. He is currently unemployed, used to work in [employer] however he's not worked for couple months. Patient states he was arrested for selling drugs about 3 years ago, he also currently has a failure to appear for a traffic ticket. Patient states he is tired of waking up and Feeling anxious, having muscle pain, tiredness, diarrhea, rhinorrhea, because he is in withdrawal. He states he is tired of being an addict. He denies feeling suicidal or depressed today, he also denies auditory/visual hallucinations. Patient is currently on probation for selling Ecstasy and has a pending court date in December, 2015.   HPI Elements:   Location:  Substance abuse. Quality:  Drug overdose. Severity:  Intentional versus accidental. Timing:  Sleep off.  Past Psychiatric History: Past Medical History  Diagnosis Date  . Heroin abuse     reports that he has been smoking.  He does not have any smokeless tobacco history on file. He reports that he uses illicit drugs (IV, Cocaine, and Marijuana). He reports that he does not drink alcohol. History reviewed. No pertinent family history.         Allergies:  No Known Allergies  ACT Assessment Complete:  NO Objective: Blood pressure 126/68, pulse 120, temperature 99.3 F (37.4 C), temperature source Oral, resp. rate 22, height 6' 1"  (1.854 m), weight 74.4 kg (164 lb 0.4 oz), SpO2 98 %.Body mass index is 21.64 kg/(m^2). Results for orders placed or performed during the hospital  encounter of 07/08/14 (from the past 72 hour(s))  Urine rapid drug screen (hosp performed)     Status: Abnormal   Collection Time: 07/08/14  3:50 AM  Result Value Ref Range   Opiates POSITIVE (A) NONE DETECTED   Cocaine NONE DETECTED NONE DETECTED    Benzodiazepines POSITIVE (A) NONE DETECTED   Amphetamines NONE DETECTED NONE DETECTED   Tetrahydrocannabinol NONE DETECTED NONE DETECTED   Barbiturates NONE DETECTED NONE DETECTED    Comment:        DRUG SCREEN FOR MEDICAL PURPOSES ONLY.  IF CONFIRMATION IS NEEDED FOR ANY PURPOSE, NOTIFY LAB WITHIN 5 DAYS.        LOWEST DETECTABLE LIMITS FOR URINE DRUG SCREEN Drug Class       Cutoff (ng/mL) Amphetamine      1000 Barbiturate      200 Benzodiazepine   017 Tricyclics       793 Opiates          300 Cocaine          300 THC              50   Blood gas, venous     Status: Abnormal   Collection Time: 07/08/14  4:47 AM  Result Value Ref Range   FIO2 0.21 %   pH, Ven 7.318 (H) 7.250 - 7.300   pCO2, Ven 50.0 45.0 - 50.0 mmHg   pO2, Ven BELOW REPORTABLE RANGE.  30.0 - 45.0 mmHg    Comment: CRITICAL RESULT CALLED TO, READ BACK BY AND VERIFIED WITH:  Denny Levy, RN AT 0505 ON 07/08/2014 BY Danella Sensing, RRT, RCP    Bicarbonate 24.9 (H) 20.0 - 24.0 mEq/L   TCO2 21.9 0 - 100 mmol/L   Acid-base deficit 1.5 0.0 - 2.0 mmol/L   O2 Saturation 56.1 %   Patient temperature 98.6    Collection site VEIN    Drawn by [id number]    Sample type VEIN   CBC with Differential     Status: Abnormal   Collection Time: 07/08/14  4:52 AM  Result Value Ref Range   WBC 5.8 4.0 - 10.5 K/uL   RBC 4.79 4.22 - 5.81 MIL/uL   Hemoglobin 16.5 13.0 - 17.0 g/dL   HCT 48.9 39.0 - 52.0 %   MCV 102.1 (H) 78.0 - 100.0 fL   MCH 34.4 (H) 26.0 - 34.0 pg   MCHC 33.7 30.0 - 36.0 g/dL   RDW 12.9 11.5 - 15.5 %   Platelets 218 150 - 400 K/uL   Neutrophils Relative % 54 43 - 77 %   Neutro Abs 3.2 1.7 - 7.7 K/uL   Lymphocytes Relative 30 12 - 46 %   Lymphs Abs 1.8 0.7 - 4.0 K/uL   Monocytes Relative 13 (H) 3 - 12 %   Monocytes Absolute 0.8 0.1 - 1.0 K/uL   Eosinophils Relative 2 0 - 5 %   Eosinophils Absolute 0.1 0.0 - 0.7 K/uL   Basophils Relative 1 0 - 1 %   Basophils Absolute 0.0 0.0 - 0.1 K/uL  Comprehensive  metabolic panel     Status: Abnormal   Collection Time: 07/08/14  4:52 AM  Result Value Ref Range   Sodium 133 (L) 135 - 145 mmol/L    Comment: Please note change in reference range.   Potassium 3.4 (L) 3.5 - 5.1 mmol/L    Comment: Please note change in reference range.   Chloride 106 96 - 112 mEq/L   CO2 22  19 - 32 mmol/L   Glucose, Bld 110 (H) 70 - 99 mg/dL   BUN 9 6 - 23 mg/dL   Creatinine, Ser 0.89 0.50 - 1.35 mg/dL   Calcium 8.0 (L) 8.4 - 10.5 mg/dL   Total Protein 6.7 6.0 - 8.3 g/dL   Albumin 4.1 3.5 - 5.2 g/dL   AST 126 (H) 0 - 37 U/L   ALT 246 (H) 0 - 53 U/L   Alkaline Phosphatase 75 39 - 117 U/L   Total Bilirubin 1.6 (H) 0.3 - 1.2 mg/dL   GFR calc non Af Amer >90 >90 mL/min   GFR calc Af Amer >90 >90 mL/min    Comment: (NOTE) The eGFR has been calculated using the CKD EPI equation. This calculation has not been validated in all clinical situations. eGFR's persistently <90 mL/min signify possible Chronic Kidney Disease.    Anion gap 5 5 - 15  Acetaminophen level     Status: Abnormal   Collection Time: 07/08/14  4:52 AM  Result Value Ref Range   Acetaminophen (Tylenol), Serum <10.0 (L) 10 - 30 ug/mL    Comment:        THERAPEUTIC CONCENTRATIONS VARY SIGNIFICANTLY. A RANGE OF 10-30 ug/mL MAY BE AN EFFECTIVE CONCENTRATION FOR MANY PATIENTS. HOWEVER, SOME ARE BEST TREATED AT CONCENTRATIONS OUTSIDE THIS RANGE. ACETAMINOPHEN CONCENTRATIONS >150 ug/mL AT 4 HOURS AFTER INGESTION AND >50 ug/mL AT 12 HOURS AFTER INGESTION ARE OFTEN ASSOCIATED WITH TOXIC REACTIONS.   Salicylate level     Status: None   Collection Time: 07/08/14  4:52 AM  Result Value Ref Range   Salicylate Lvl <8.7 2.8 - 20.0 mg/dL  Ethanol     Status: None   Collection Time: 07/08/14  4:52 AM  Result Value Ref Range   Alcohol, Ethyl (B) <5 0 - 9 mg/dL    Comment:        LOWEST DETECTABLE LIMIT FOR SERUM ALCOHOL IS 11 mg/dL FOR MEDICAL PURPOSES ONLY   I-stat troponin, ED     Status: None    Collection Time: 07/08/14  5:02 AM  Result Value Ref Range   Troponin i, poc 0.00 0.00 - 0.08 ng/mL   Comment 3            Comment: Due to the release kinetics of cTnI, a negative result within the first hours of the onset of symptoms does not rule out myocardial infarction with certainty. If myocardial infarction is still suspected, repeat the test at appropriate intervals.   I-stat chem 8, ed     Status: Abnormal   Collection Time: 07/08/14  5:04 AM  Result Value Ref Range   Sodium 141 135 - 145 mmol/L   Potassium 3.9 3.5 - 5.1 mmol/L   Chloride 105 96 - 112 mEq/L   BUN 9 6 - 23 mg/dL   Creatinine, Ser 0.90 0.50 - 1.35 mg/dL   Glucose, Bld 112 (H) 70 - 99 mg/dL   Calcium, Ion 1.09 (L) 1.12 - 1.23 mmol/L   TCO2 23 0 - 100 mmol/L   Hemoglobin 17.7 (H) 13.0 - 17.0 g/dL   HCT 52.0 39.0 - 52.0 %  I-Stat CG4 Lactic Acid, ED     Status: None   Collection Time: 07/08/14  5:12 AM  Result Value Ref Range   Lactic Acid, Venous 1.21 0.5 - 2.2 mmol/L  Blood gas, arterial     Status: None   Collection Time: 07/08/14  8:44 AM  Result Value Ref Range   FIO2 0.21 %  Delivery systems ROOM AIR    pH, Arterial 7.400 7.350 - 7.450   pCO2 arterial 36.8 35.0 - 45.0 mmHg   pO2, Arterial 90.2 80.0 - 100.0 mmHg   Bicarbonate 22.3 20.0 - 24.0 mEq/L   TCO2 19.0 0 - 100 mmol/L   Acid-base deficit 1.5 0.0 - 2.0 mmol/L   O2 Saturation 97.4 %   Patient temperature 98.6    Collection site BRACHIAL ARTERY    Drawn by [id number]    Sample type ARTERIAL   CBG monitoring, ED     Status: None   Collection Time: 07/08/14 11:15 AM  Result Value Ref Range   Glucose-Capillary 89 70 - 99 mg/dL   Comment 1 Documented in Chart    Comment 2 Notify RN   HIV antibody     Status: None   Collection Time: 07/08/14 11:21 AM  Result Value Ref Range   HIV 1/O/2 Abs-Index Value <1.00 <1.00    Comment: Index Value: Specimen reactivity relative to the negative cutoff.   HIV-1/HIV-2 Ab Non Reactive Non Reactive     Comment: (NOTE) Performed At: [facility] [place], [place] [id number] Lindon Romp MD YP:[id number]   CBG monitoring, ED     Status: Abnormal   Collection Time: 07/08/14 11:53 AM  Result Value Ref Range   Glucose-Capillary 106 (H) 70 - 99 mg/dL  Triglycerides     Status: None   Collection Time: 07/08/14 11:55 AM  Result Value Ref Range   Triglycerides 81 <150 mg/dL    Comment: Performed at [facility]  Blood gas, arterial     Status: Abnormal   Collection Time: 07/08/14 12:04 PM  Result Value Ref Range   FIO2 1.00 %   Delivery systems VENTILATOR    Mode PRESSURE REGULATED VOLUME CONTROL    VT 500 mL   Rate 16 resp/min   Peep/cpap 5.0 cm H20   pH, Arterial 7.419 7.350 - 7.450   pCO2 arterial 33.5 (L) 35.0 - 45.0 mmHg   pO2, Arterial 527.0 (H) 80.0 - 100.0 mmHg   Bicarbonate 21.3 20.0 - 24.0 mEq/L   TCO2 18.1 0 - 100 mmol/L   Acid-base deficit 1.9 0.0 - 2.0 mmol/L   O2 Saturation 99.4 %   Patient temperature 98.6    Collection site BRACHIAL ARTERY    Drawn by [id number]    Sample type ARTERIAL DRAW   Glucose, capillary     Status: None   Collection Time: 07/08/14  2:19 PM  Result Value Ref Range   Glucose-Capillary 73 70 - 99 mg/dL  MRSA PCR Screening     Status: Abnormal   Collection Time: 07/08/14  2:35 PM  Result Value Ref Range   MRSA by PCR POSITIVE (A) NEGATIVE    Comment:        The GeneXpert MRSA Assay (FDA approved for NASAL specimens only), is one component of a comprehensive MRSA colonization surveillance program. It is not intended to diagnose MRSA infection nor to guide or monitor treatment for MRSA infections. RESULT CALLED TO, READ BACK BY AND VERIFIED WITH: J. BISHOP RN 17:15 07/08/14 ([email or web address])   Glucose, capillary     Status: Abnormal   Collection Time: 07/08/14  6:23 PM  Result Value Ref Range   Glucose-Capillary 64 (L) 70 - 99 mg/dL  Glucose, capillary     Status: Abnormal   Collection Time: 07/09/14 12:13 AM   Result Value Ref Range   Glucose-Capillary 102 (H) 70 - 99 mg/dL  CBC     Status: Abnormal   Collection Time: 07/09/14  2:54 AM  Result Value Ref Range   WBC 8.3 4.0 - 10.5 K/uL   RBC 4.89 4.22 - 5.81 MIL/uL   Hemoglobin 16.7 13.0 - 17.0 g/dL   HCT 48.4 39.0 - 52.0 %   MCV 99.0 78.0 - 100.0 fL   MCH 34.2 (H) 26.0 - 34.0 pg   MCHC 34.5 30.0 - 36.0 g/dL   RDW 13.2 11.5 - 15.5 %   Platelets 218 150 - 400 K/uL  Basic metabolic panel     Status: Abnormal   Collection Time: 07/09/14  2:54 AM  Result Value Ref Range   Sodium 143 135 - 145 mmol/L    Comment: Please note change in reference range.   Potassium 3.5 3.5 - 5.1 mmol/L    Comment: Please note change in reference range.   Chloride 115 (H) 96 - 112 mEq/L   CO2 22 19 - 32 mmol/L   Glucose, Bld 101 (H) 70 - 99 mg/dL   BUN 5 (L) 6 - 23 mg/dL   Creatinine, Ser 0.95 0.50 - 1.35 mg/dL   Calcium 8.5 8.4 - 10.5 mg/dL   GFR calc non Af Amer >90 >90 mL/min   GFR calc Af Amer >90 >90 mL/min    Comment: (NOTE) The eGFR has been calculated using the CKD EPI equation. This calculation has not been validated in all clinical situations. eGFR's persistently <90 mL/min signify possible Chronic Kidney Disease.    Anion gap 6 5 - 15  Magnesium     Status: None   Collection Time: 07/09/14  2:54 AM  Result Value Ref Range   Magnesium 2.0 1.5 - 2.5 mg/dL  Glucose, capillary     Status: None   Collection Time: 07/09/14  6:18 AM  Result Value Ref Range   Glucose-Capillary 95 70 - 99 mg/dL   Comment 1 Documented in Chart    Comment 2 Notify RN    Labs are reviewed and are pertinent for opioids and benzo's and elevated LFT's.  Current Facility-Administered Medications  Medication Dose Route Frequency Provider Last Rate Last Dose  . 0.9 %  sodium chloride infusion  250 mL Intravenous PRN Donita Brooks, NP      . Chlorhexidine Gluconate Cloth 2 % PADS 6 each  6 each Topical Q0600 Brand Males, MD   6 each at 07/09/14 0522  . dextrose 5  %-0.45 % sodium chloride infusion   Intravenous Continuous Tasrif Ahmed, MD 75 mL/hr at 07/09/14 0900    . fentaNYL (SUBLIMAZE) injection 25-50 mcg  25-50 mcg Intravenous Q2H PRN Wilhelmina Mcardle, MD      . folic acid injection 1 mg  1 mg Intravenous Daily Tasrif Ahmed, MD   1 mg at 07/09/14 0952  . mupirocin ointment (BACTROBAN) 2 % 1 application  1 application Nasal BID Brand Males, MD   1 application at 74/14/23 0955  . ondansetron (ZOFRAN) injection 4 mg  4 mg Intravenous Once Wandra Arthurs, MD      . thiamine (B-1) injection 100 mg  100 mg Intravenous Daily Tasrif Ahmed, MD   100 mg at 07/09/14 9532    Psychiatric Specialty Exam: Physical Exam as per history and physical   ROS anxiety and being tired   Blood pressure 126/68, pulse 120, temperature 99.3 F (37.4 C), temperature source Oral, resp. rate 22, height 6' 1"  (1.854 m), weight 74.4 kg (164 lb 0.4 oz), SpO2  98 %.Body mass index is 21.64 kg/(m^2).  General Appearance: Disheveled and Guarded  Eye Contact::  Good  Speech:  Clear and Coherent  Volume:  Normal  Mood:  Anxious and Depressed  Affect:  Appropriate and Congruent  Thought Process:  Coherent and Goal Directed  Orientation:  Full (Time, Place, and Person)  Thought Content:  WDL  Suicidal Thoughts:  No  Homicidal Thoughts:  No  Memory:  Immediate;   Good Recent;   Good  Judgement:  Impaired  Insight:  Lacking  Psychomotor Activity:  Decreased  Concentration:  Fair  Recall:  [place] of Knowledge:Good  Language: Good  Akathisia:  NA  Handed:  Right  AIMS (if indicated):     Assets:  Communication Skills Desire for Improvement Financial Resources/Insurance Housing Intimacy Leisure Time Resilience Social Support Transportation  Sleep:      Musculoskeletal: Strength & Muscle Tone: decreased Gait & Station: unable to stand Patient leans: N/A  Treatment Plan Summary: Daily contact with patient to assess and evaluate symptoms and progress in  treatment Medication management  Recommended continue current medication management and treatment plan and monitor for the withdrawal symptoms of opiates and benzodiazepines Patient benefit from residential substance abuse rehabilitation treatment when medically cleared Patient is reluctant to participate in any program at this time. Refer to the psychiatric social services to contact patient probation officer who he might listen regarding needed rehabilitation treatment.  Yehya Brendle,JANARDHAHA R. 07/09/2014 11:37 AM

## 2014-07-09 NOTE — Progress Notes (Signed)
Patient d/c home, stable ambulated and had dinner. D/C instructions given, questions and concerns addressed. Patient appears satisfy. Going home with mother.
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
## 2014-07-09 NOTE — Clinical Social Work Note (Signed)
Clinical Social Worker has assessed patient. Full psychosocial to follow.   Derenda FennelBashira Myca Perno, MSW, LCSWA [phone] 07/09/2014 4:47 PM
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
## 2014-09-03 ENCOUNTER — Emergency Department: Payer: Self-pay | Admitting: Emergency Medicine

## 2014-11-02 ENCOUNTER — Encounter: Payer: Self-pay | Admitting: Medical Oncology

## 2014-11-02 ENCOUNTER — Emergency Department: Payer: Self-pay

## 2014-11-02 ENCOUNTER — Emergency Department
Admission: EM | Admit: 2014-11-02 | Discharge: 2014-11-02 | Disposition: A | Discharge status: Home / Self Care | Payer: Self-pay | Attending: Emergency Medicine | Admitting: Emergency Medicine

## 2014-11-02 DIAGNOSIS — Z720 Tobacco use: Secondary | ICD-10-CM | POA: Insufficient documentation

## 2014-11-02 DIAGNOSIS — Y998 Other external cause status: Secondary | ICD-10-CM | POA: Insufficient documentation

## 2014-11-02 DIAGNOSIS — Y9289 Other specified places as the place of occurrence of the external cause: Secondary | ICD-10-CM | POA: Insufficient documentation

## 2014-11-02 DIAGNOSIS — M62830 Muscle spasm of back: Secondary | ICD-10-CM | POA: Insufficient documentation

## 2014-11-02 DIAGNOSIS — X58XXXA Exposure to other specified factors, initial encounter: Secondary | ICD-10-CM | POA: Insufficient documentation

## 2014-11-02 DIAGNOSIS — Y9389 Activity, other specified: Secondary | ICD-10-CM | POA: Insufficient documentation

## 2014-11-02 DIAGNOSIS — S39012A Strain of muscle, fascia and tendon of lower back, initial encounter: Secondary | ICD-10-CM | POA: Insufficient documentation

## 2014-11-02 MED ORDER — ORPHENADRINE CITRATE 30 MG/ML IJ SOLN
60.0000 mg | Freq: Two times a day (BID) | INTRAMUSCULAR | Status: DC
  Administered 2014-11-02: 60 mg via INTRAMUSCULAR

## 2014-11-02 MED ORDER — DEXAMETHASONE SODIUM PHOSPHATE 10 MG/ML IJ SOLN
10.0000 mg | Freq: Once | INTRAMUSCULAR | Status: AC
  Administered 2014-11-02: 10 mg via INTRAVENOUS

## 2014-11-02 MED ORDER — DEXAMETHASONE SODIUM PHOSPHATE 10 MG/ML IJ SOLN
INTRAMUSCULAR | Status: AC
  Filled 2014-11-02: qty 1

## 2014-11-02 MED ORDER — CYCLOBENZAPRINE HCL 10 MG PO TABS: 10.0000 mg | ORAL_TABLET | Freq: Three times a day (TID) | ORAL | Status: AC | PRN

## 2014-11-02 MED ORDER — ORPHENADRINE CITRATE 30 MG/ML IJ SOLN
INTRAMUSCULAR | Status: AC
  Filled 2014-11-02: qty 2

## 2014-11-02 MED ORDER — PREDNISONE 10 MG (21) PO TBPK: ORAL_TABLET | ORAL | Status: DC

## 2014-11-02 NOTE — ED Provider Notes (Signed)
[facility] Emergency Department Provider Note  ____________________________________________  Time seen: Approximately 1818  I have reviewed the triage vital signs and the nursing notes.   HISTORY  Chief Complaint Back Pain    HPI Alessandra BevelsMichael Q Esker is a 29 y.o. male who is here for complaint of low back pain states that he was helping lift a motor felt something pop in his back and is having pain he took a couple Percocet at home and still rates his pain as about a 9 out of 10 denies any numbness tingling or weakness to his lower extremities any bowel or bladder incontinence pain is relieved by sitting still worse with any type of movement or touch   Past Medical History  Diagnosis Date  . Heroin abuse     Patient Active Problem List   Diagnosis Date Noted  . Overdose 07/08/2014  . Coma 07/08/2014  . Acute respiratory failure 07/08/2014  . Opioid overdose   . Opioid dependence with withdrawal 03/01/2014    Past Surgical History  Procedure Laterality Date  . Appendectomy      Current Outpatient Rx  Name  Route  Sig  Dispense  Refill  . acetaminophen (TYLENOL) 500 MG tablet   Oral   Take 1,000 mg by mouth every 6 (six) hours as needed for mild pain.         . cyclobenzaprine (FLEXERIL) 10 MG tablet   Oral   Take 1 tablet (10 mg total) by mouth every 8 (eight) hours as needed for muscle spasms.   10 tablet   1   . predniSONE (STERAPRED UNI-PAK 21 TAB) 10 MG (21) TBPK tablet      Take 6 tablets on day 1 Take 5 tablets on day 2 Take 4 tablets on day 3 Take 3 tablets on day 4 Take 2 tablets on day 5 Take 1 tablet on day 6   21 tablet   0     Allergies Review of patient's allergies indicates no known allergies.  No family history on file.  Social History History  Substance Use Topics  . Smoking status: Current Every Day Smoker -- 0.50 packs/day  . Smokeless tobacco: Not on file  . Alcohol Use: No    Review of  Systems Constitutional: No fever/chills Eyes: No visual changes. ENT: No sore throat. Cardiovascular: Denies chest pain. Respiratory: Denies shortness of breath. Gastrointestinal: No abdominal pain.  No nausea, no vomiting.  No diarrhea.  No constipation. Genitourinary: Negative for dysuria. Musculoskeletal: Negative for back pain. Skin: Negative for rash. Neurological: Negative for headaches, focal weakness or numbness.  6-point ROS otherwise negative.  ____________________________________________   PHYSICAL EXAM:  VITAL SIGNS: ED Triage Vitals  Enc Vitals Group     BP 11/02/14 1727 133/85 mmHg     Pulse Rate 11/02/14 1727 84     Resp 11/02/14 1727 18     Temp 11/02/14 1727 97.6 F (36.4 C)     Temp Source 11/02/14 1727 Oral     SpO2 11/02/14 1727 96 %     Weight 11/02/14 1727 165 lb (74.844 kg)     Height 11/02/14 1727 6\' 1"  (1.854 m)     Head Cir --      Peak Flow --      Pain Score 11/02/14 1749 9     Pain Loc --      Pain Edu? --      Excl. in GC? --     Constitutional: Alert  and oriented. Well appearing and in no acute distress. Resting comfortably in the bed Tech sting on his cell phone Eyes: Conjunctivae are normal. PERRL. EOMI. Head: Atraumatic. Nose: No congestion/rhinnorhea. Mouth/Throat: Mucous membranes are moist.  Oropharynx non-erythematous. Neck: No stridor.   }Cardiovascular: Normal rate, regular rhythm. Grossly normal heart sounds.  Good peripheral circulation. Respiratory: Normal respiratory effort.  No retractions. Lungs CTAB. }Musculoskeletal: No lower extremity tenderness nor edema.  No joint effusions. Pain reaction with palpation across his low back which does feel tight spasm Neurologic:  Normal speech and language. No gross focal neurologic deficits are appreciated. Speech is normal. No gait instability. Skin:  Skin is warm, dry and intact. No rash noted. Psychiatric: Mood and affect are normal. Speech and behavior are  normal.  ____________________________________________     RADIOLOGY x-rays patient lumbar sacral spine were negative   PROCEDURES  Procedure(s) performed: None  Critical Care performed: No  ____________________________________________   INITIAL IMPRESSION / ASSESSMENT AND PLAN / ED COURSE  Pertinent labs & imaging results that were available during my care of the patient were reviewed by me and considered in my medical decision making (see chart for details).  Initial impression lumbar sacral strain and muscle spasm patient be discharged with prescriptions for prednisone and Flexeril as discussed with the patient given his history of drug abuse narcotics and benzos that we would be avoiding those medications during this visit he should follow-up with his doctor as needed for any further concerns return if any acute concerns or worsening symptoms ____________________________________________   FINAL CLINICAL IMPRESSION(S) / ED DIAGNOSES  Final diagnoses:  Lumbosacral strain, initial encounter  Spasm of back muscles     Mirayah Wren Rosalyn GessWilliam C Kenslei Hearty, PA-C 11/02/14 1937  Sharyn CreamerMark Quale, MD 11/06/14 1659

## 2014-11-02 NOTE — ED Notes (Signed)
Lower back pain since lifting car motor earlier today.

## 2014-11-02 NOTE — ED Notes (Signed)
Pt to xray at this time.

## 2015-01-03 ENCOUNTER — Emergency Department (INDEPENDENT_AMBULATORY_CARE_PROVIDER_SITE_OTHER)
Admission: EM | Admit: 2015-01-03 | Discharge: 2015-01-03 | Disposition: A | Discharge status: Home / Self Care | Payer: Self-pay | Source: Home / Self Care

## 2015-01-03 ENCOUNTER — Encounter (HOSPITAL_COMMUNITY): Payer: Self-pay

## 2015-01-03 ENCOUNTER — Emergency Department (HOSPITAL_COMMUNITY)
Admission: EM | Admit: 2015-01-03 | Discharge: 2015-01-03 | Discharge status: Absent without leave | Payer: Self-pay | Source: Home / Self Care

## 2015-01-03 DIAGNOSIS — L0291 Cutaneous abscess, unspecified: Secondary | ICD-10-CM

## 2015-01-03 DIAGNOSIS — F1990 Other psychoactive substance use, unspecified, uncomplicated: Secondary | ICD-10-CM

## 2015-01-03 HISTORY — DX: Methicillin resistant Staphylococcus aureus infection, unspecified site: A49.02

## 2015-01-03 MED ORDER — DOXYCYCLINE HYCLATE 100 MG PO CAPS: 100.0000 mg | ORAL_CAPSULE | Freq: Two times a day (BID) | ORAL | Status: DC

## 2015-01-03 MED ORDER — IBUPROFEN 800 MG PO TABS: 800.0000 mg | ORAL_TABLET | Freq: Three times a day (TID) | ORAL | Status: DC

## 2015-01-03 NOTE — ED Notes (Signed)
C/o 1 week duration of painful swelling on left forearm . Admitted prior history of IV drug use, and the area in question is on one of his needle tracts ( per patient) denies any recent IV drug use

## 2015-01-03 NOTE — Discharge Instructions (Signed)
Incision and Drainage Incision and drainage is a procedure in which a sac-like structure (cystic structure) is opened and drained. The area to be drained usually contains material such as pus, fluid, or blood.  LET YOUR CAREGIVER KNOW ABOUT:   Allergies to medicine.  Medicines taken, including vitamins, herbs, eyedrops, over-the-counter medicines, and creams.  Use of steroids (by mouth or creams).  Previous problems with anesthetics or numbing medicines.  History of bleeding problems or blood clots.  Previous surgery.  Other health problems, including diabetes and kidney problems.  Possibility of pregnancy, if this applies. RISKS AND COMPLICATIONS  Pain.  Bleeding.  Scarring.  Infection. BEFORE THE PROCEDURE  You may need to have an ultrasound or other imaging tests to see how large or deep your cystic structure is. Blood tests may also be used to determine if you have an infection or how severe the infection is. You may need to have a tetanus shot. PROCEDURE  The affected area is cleaned with a cleaning fluid. The cyst area will then be numbed with a medicine (local anesthetic). A small incision will be made in the cystic structure. A syringe or catheter may be used to drain the contents of the cystic structure, or the contents may be squeezed out. The area will then be flushed with a cleansing solution. After cleansing the area, it is often gently packed with a gauze or another wound dressing. Once it is packed, it will be covered with gauze and tape or some other type of wound dressing. AFTER THE PROCEDURE   Often, you will be allowed to go home right after the procedure.  You may be given antibiotic medicine to prevent or heal an infection.  If the area was packed with gauze or some other wound dressing, you will likely need to come back in 1 to 2 days to get it removed.  The area should heal in about 14 days. Document Released: 12/05/2000 Document Revised: 12/11/2011  Document Reviewed: 08/06/2011 Exit[employer] Patient Information 2015 [employer]. This information is not intended to replace advice given to you by your health care provider. Make sure you discuss any questions you have with your health care provider.  

## 2015-01-03 NOTE — ED Notes (Signed)
Non-stick pad , 4x4 gauze applied, secured w ace wrap

## 2015-01-03 NOTE — ED Provider Notes (Signed)
CSN: [id number]     Arrival date & time 01/03/15  1829 History   None    Chief Complaint  Patient presents with  . Skin Problem   (Consider location/radiation/quality/duration/timing/severity/associated sxs/prior Treatment)  HPI   The patient is a 29 year old male presenting tonight with complaints of arm pain and swelling following injection of IV drugs 4-5 days ago. Patient states he "thinks he missed" when he last injected.  Denies, fever, nausea or vomiting.   Past Medical History  Diagnosis Date  . Heroin abuse    Past Surgical History  Procedure Laterality Date  . Appendectomy     History reviewed. No pertinent family history. History  Substance Use Topics  . Smoking status: Current Every Day Smoker -- 0.50 packs/day  . Smokeless tobacco: Not on file  . Alcohol Use: No    Review of Systems  Constitutional: Negative.  Negative for fever and fatigue.  HENT: Negative.   Eyes: Negative.   Respiratory: Negative.   Cardiovascular: Negative.   Gastrointestinal: Negative.   Endocrine: Negative.   Genitourinary: Negative.   Musculoskeletal: Negative.   Skin: Positive for wound.  Allergic/Immunologic: Negative.   Neurological: Negative.   Hematological: Negative.   Psychiatric/Behavioral: Negative.     Allergies  Review of patient's allergies indicates no known allergies.  Home Medications   Prior to Admission medications   Medication Sig Start Date End Date Taking? Authorizing Provider  acetaminophen (TYLENOL) 500 MG tablet Take 1,000 mg by mouth every 6 (six) hours as needed for mild pain.    Historical Provider, MD  cyclobenzaprine (FLEXERIL) 10 MG tablet Take 1 tablet (10 mg total) by mouth every 8 (eight) hours as needed for muscle spasms. 11/02/14 11/02/15  III William C Ruffian, PA-C  doxycycline (VIBRAMYCIN) 100 MG capsule Take 1 capsule (100 mg total) by mouth 2 (two) times daily. 01/03/15   Servando Salinaatherine H Rossi, NP  ibuprofen (ADVIL,MOTRIN) 800 MG tablet Take 1  tablet (800 mg total) by mouth 3 (three) times daily. 01/03/15   Servando Salinaatherine H Rossi, NP  predniSONE (STERAPRED UNI-PAK 21 TAB) 10 MG (21) TBPK tablet Take 6 tablets on day 1 Take 5 tablets on day 2 Take 4 tablets on day 3 Take 3 tablets on day 4 Take 2 tablets on day 5 Take 1 tablet on day 6 11/02/14   III William C Ruffian, PA-C   BP 126/82 mmHg  Pulse 83  Temp(Src) 98.2 F (36.8 C) (Oral)  Resp 18  SpO2 98%   Physical Exam  Constitutional: He is oriented to person, place, and time. He appears well-developed and well-nourished. No distress.  Cardiovascular: Normal rate, regular rhythm, normal heart sounds and intact distal pulses.  Exam reveals no gallop and no friction rub.   No murmur heard. Pulmonary/Chest: Effort normal and breath sounds normal. No respiratory distress. He has no wheezes. He has no rales. He exhibits no tenderness.  Neurological: He is alert and oriented to person, place, and time.  Skin: Skin is warm and dry. He is not diaphoretic.     The vein proximal and distal to this lesion is discolored and sclerotic.   Psychiatric:  The patient appears nervous and jittery. Patient admits to IV drug use and states last time he used was 4-5 days ago. Patient denies currently being in withdrawal and refuses offer of rehabilitation assistance.  Nursing note and vitals reviewed.   ED Course  INCISION AND DRAINAGE Date/Time: 01/03/2015 7:50 PM Performed by: Servando SalinaOSSI, CATHERINE H Authorized by: Andrey FarmerOSSI,  CATHERINE H Consent: Verbal consent obtained. Risks and benefits: risks, benefits and alternatives were discussed Consent given by: patient Patient understanding: patient states understanding of the procedure being performed Patient identity confirmed: verbally with patient and arm band Type: abscess Body area: upper extremity Location details: left arm Anesthesia: local infiltration Local anesthetic: lidocaine 2% with epinephrine and topical anesthetic Anesthetic total: 4  ml Patient sedated: no Scalpel size: 11 Needle gauge: 18 Incision type: single straight Complexity: simple Drainage: purulent and  bloody Drainage amount: scant Wound treatment: wound left open Packing material: none Patient tolerance: Patient tolerated the procedure well with no immediate complications Comments: Patient had small area of fluctuance.  Small straight 1/4" incision made vertically and left open.  Abscess irrigated and flushed well with butterfly syringe tubing and normal saline til clear.  Wound culture of drainage obtained.  Abscess surrounded by large firm nonfluctuant area.       (including critical care time) Labs Review Labs Reviewed - No data to display  Imaging Review No results found.    MDM   1. Abscess   2. IV drug user    Meds ordered this encounter  Medications  . ibuprofen (ADVIL,MOTRIN) 800 MG tablet    Sig: Take 1 tablet (800 mg total) by mouth 3 (three) times daily.    Dispense:  21 tablet    Refill:  0  . doxycycline (VIBRAMYCIN) 100 MG capsule    Sig: Take 1 capsule (100 mg total) by mouth 2 (two) times daily.    Dispense:  20 capsule    Refill:  0    Plan of care and evaluation was discussed with Dr. Milus Glazier.  The patient is to follow up in 2 days to ensure wound healing correctly.  The patient verbalizes understanding and agrees to plan of care.        [name], NP 01/03/15 2002

## 2015-01-06 ENCOUNTER — Encounter (HOSPITAL_COMMUNITY): Payer: Self-pay

## 2015-01-06 LAB — WOUND CULTURE

## 2015-01-06 NOTE — ED Notes (Signed)
Final report of wound culture positive for MRSA. Treated adequately w Rx provided on day of visit. Called number provided on day of visit as mobile number , and left detailed VM for patient that he needs to be sure to finish Rx as written , as per instruction on day of visit

## 2016-03-19 IMAGING — CR DG THORACIC SPINE 2-3V
1 series · 3 of 3 positions shown · non-contrast
Comparison: None.

CLINICAL DATA: Thoracic spine pain after fall from scaffolding
earlier this day.

EXAM:
THORACIC SPINE - 2 VIEW

[Series 1: t thoracic spine ap · 0.14mm/px · 3 of 3 slices shown]
[im 1/3]
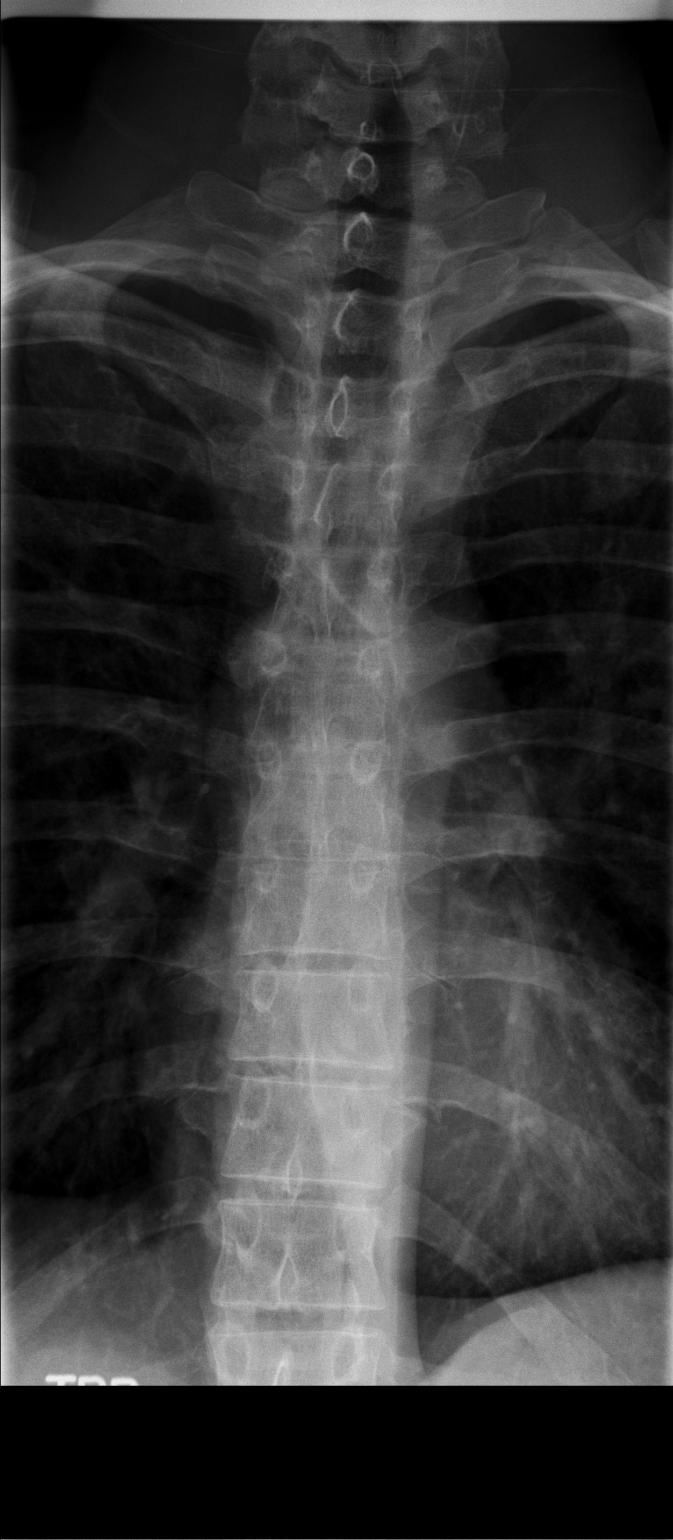
[im 2/3]
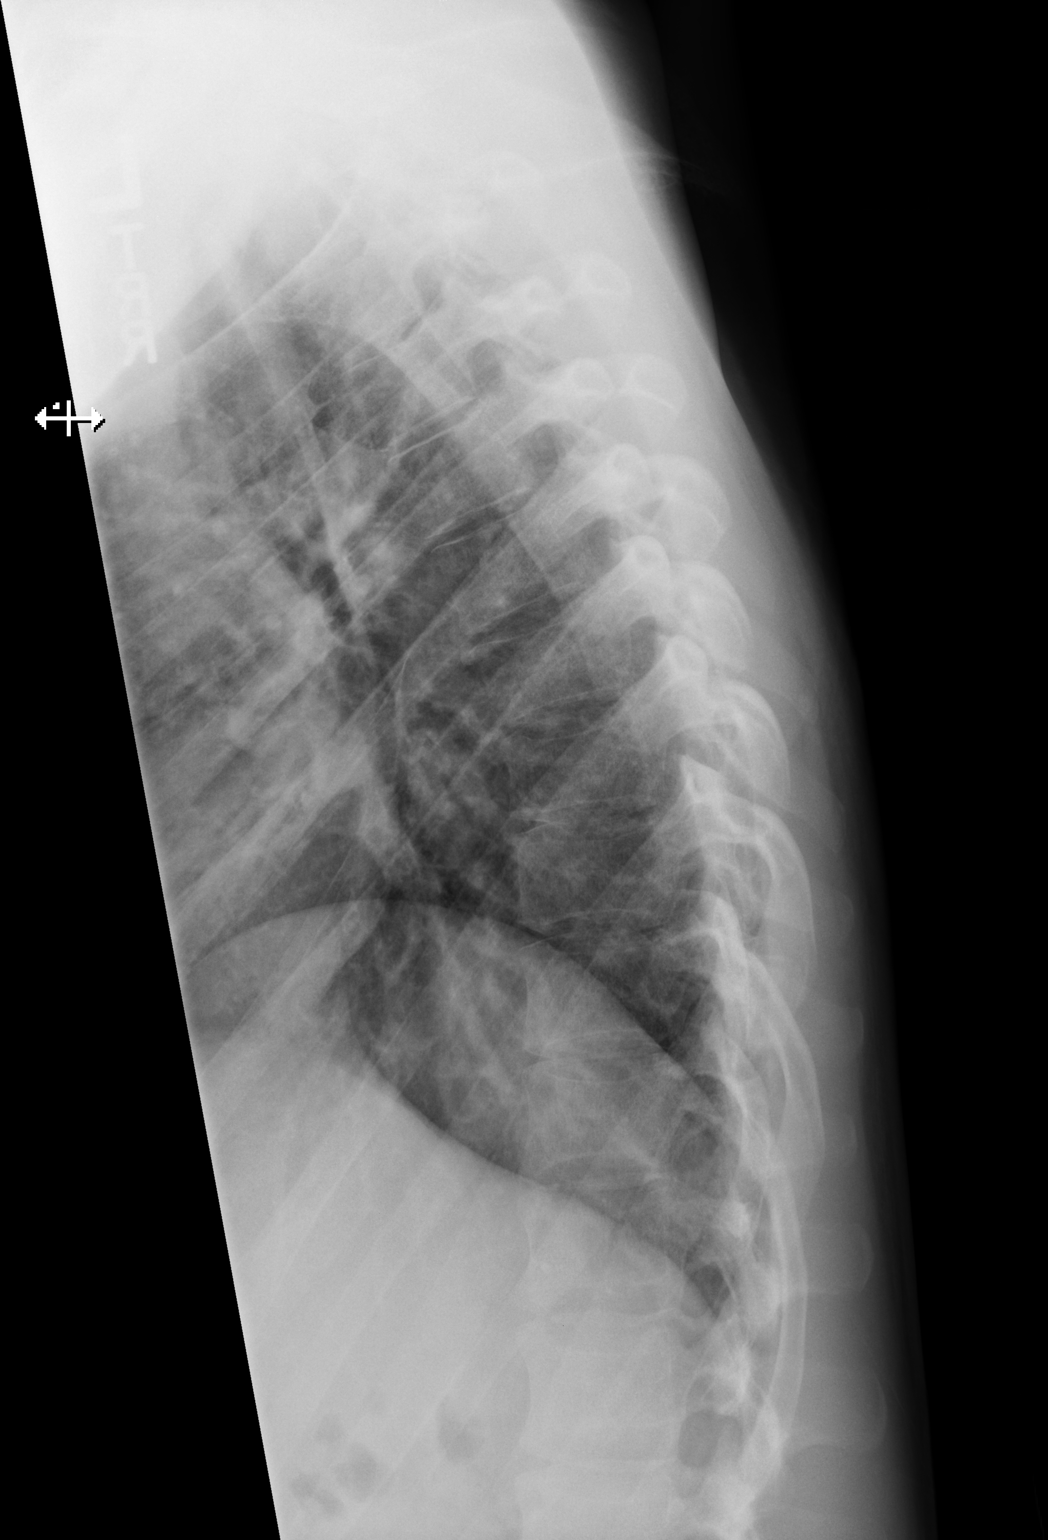
[im 3/3]
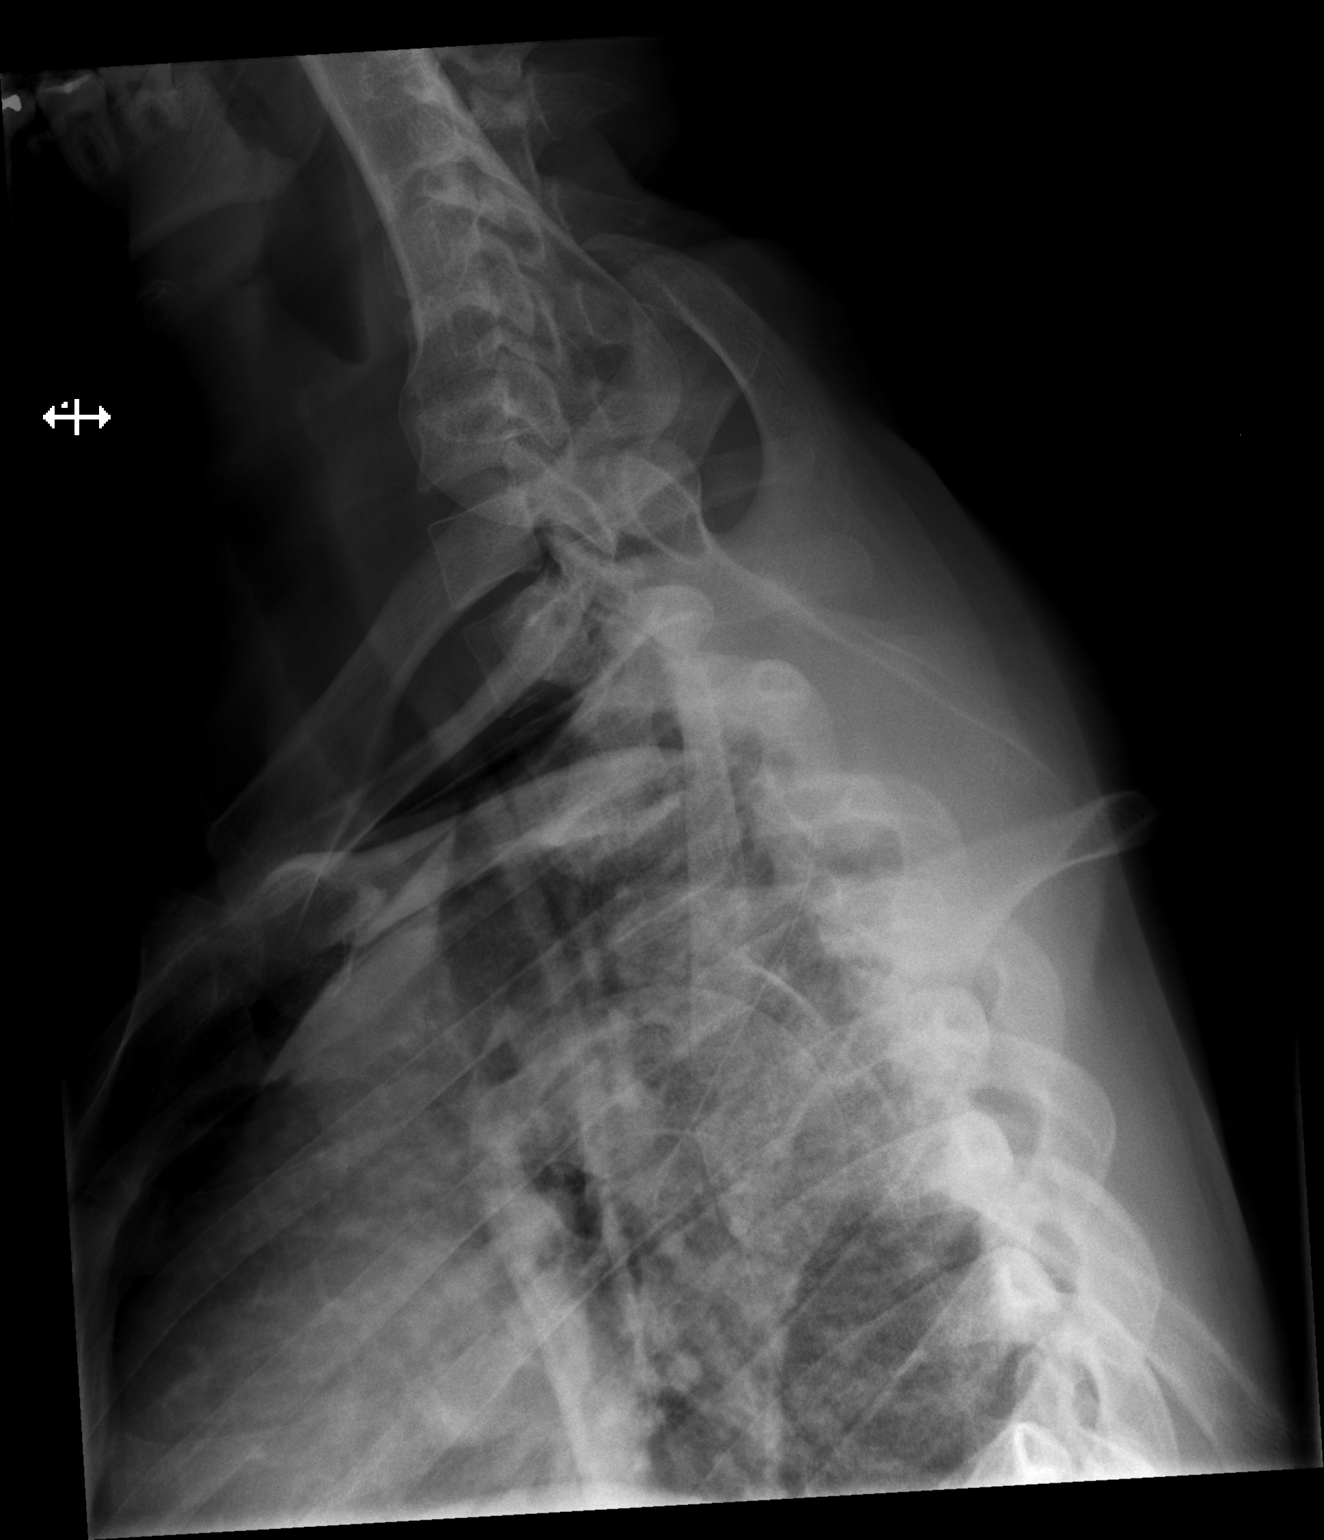

[3 of 3 positions shown; findings below may reference images not displayed]

FINDINGS: The alignment is maintained. Vertebral body heights are maintained.
No significant disc space narrowing. Posterior elements appear
intact. There is no paravertebral soft tissue abnormality.
IMPRESSION: No fracture or subluxation of the thoracic spine.

## 2016-03-19 IMAGING — CR DG LUMBAR SPINE 2-3V
1 series · 3 of 3 positions shown · non-contrast
Comparison: None.

CLINICAL DATA: Lumbar spine pain after fall from scaffolding
earlier this day. Stabbing pain in the lower back radiating to the
left leg.

EXAM:
LUMBAR SPINE - 2-3 VIEW

[Series 1: t lumbar spine ap · 0.14mm/px · 3 of 3 slices shown]
[im 1/3]
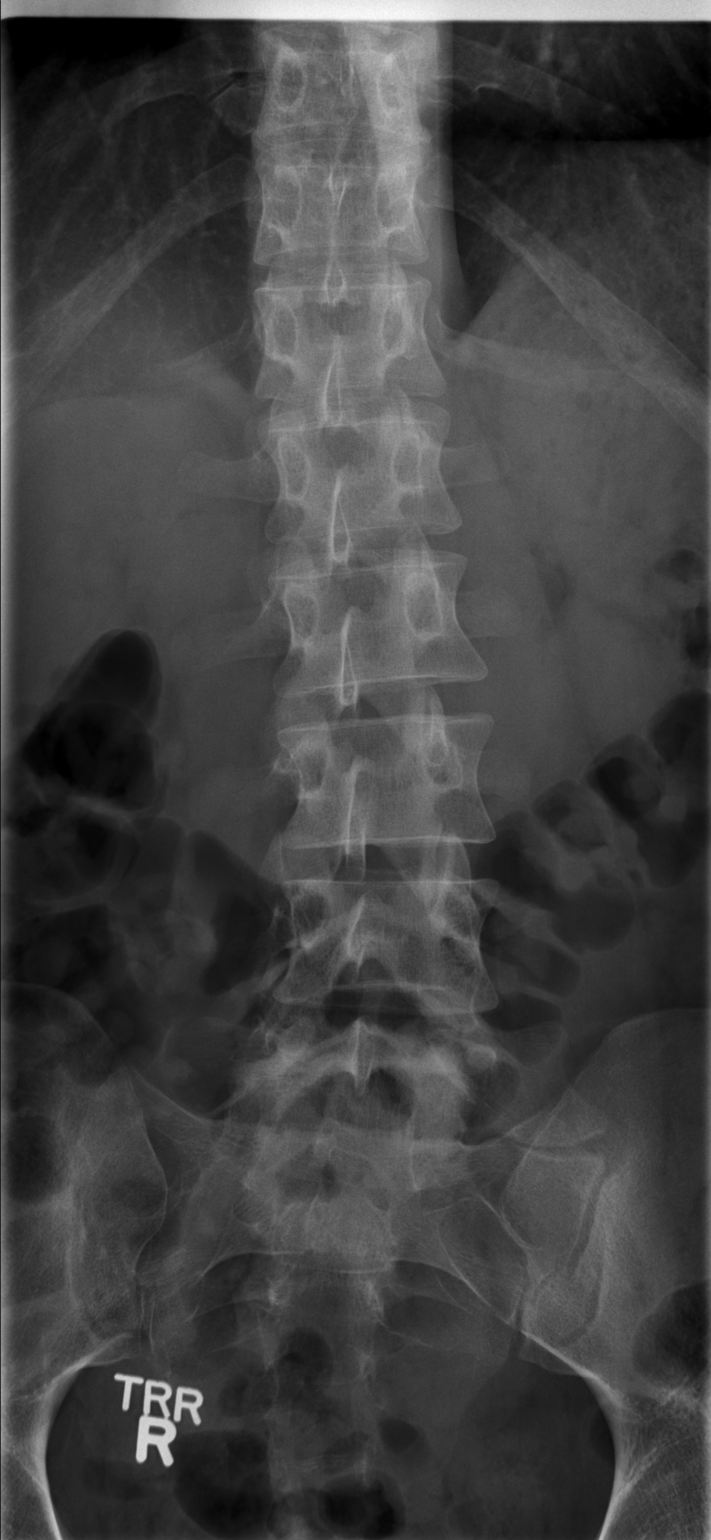
[im 2/3]
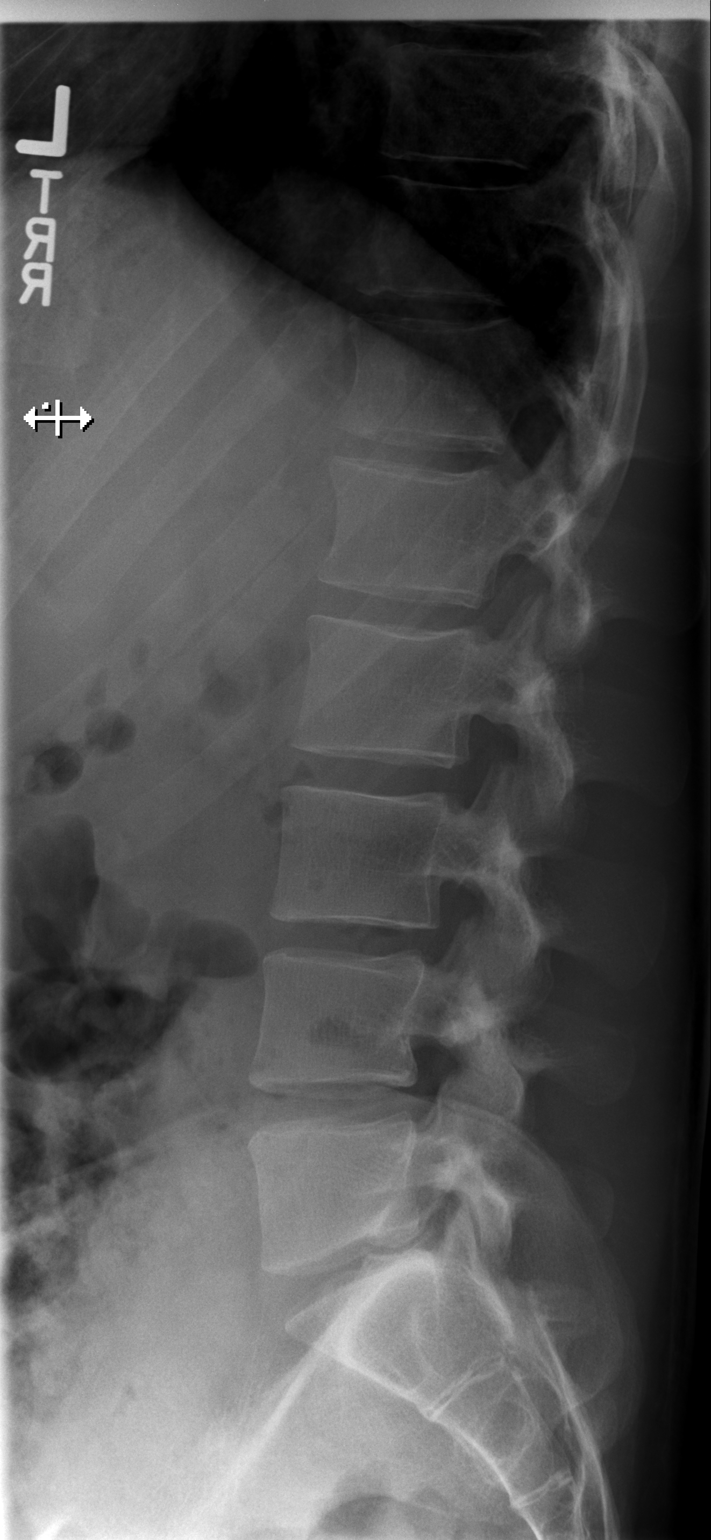
[im 3/3]
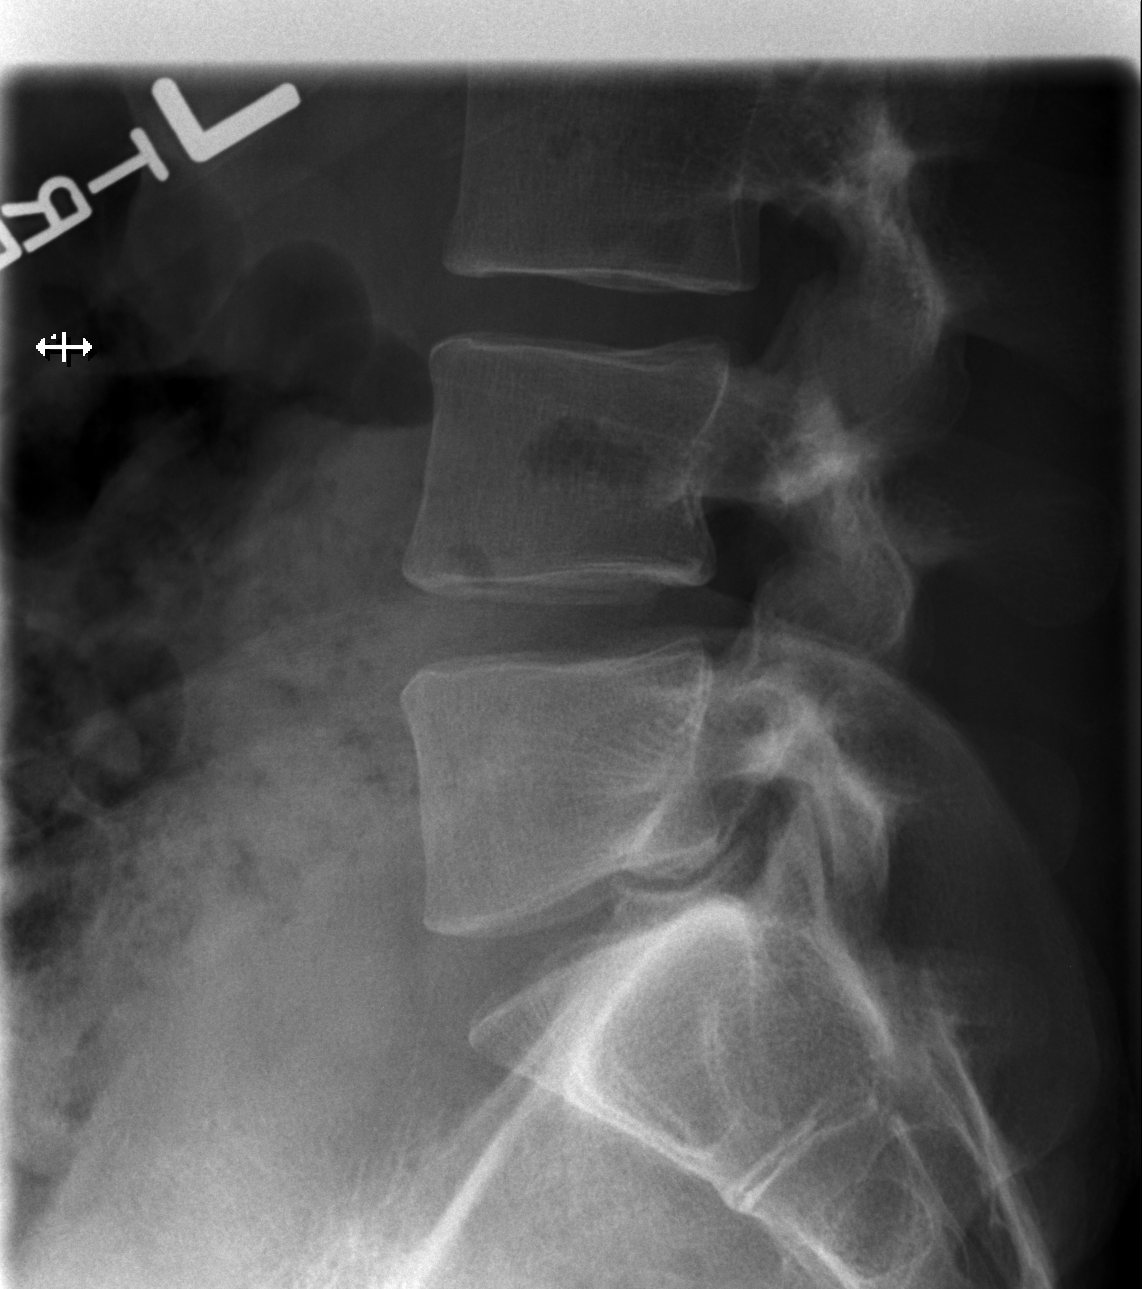

[3 of 3 positions shown; findings below may reference images not displayed]

FINDINGS: There is hemi transitional lumbosacral anatomy with enlarged left
transverse process of the a transitional lumbosacral vertebra. The
alignment is maintained. Vertebral body heights are normal. There is
no listhesis. The posterior elements are intact. Minimal disc space
narrowing at L5-S1, remaining disc spaces are preserved. No
fracture. Sacroiliac joints are symmetric and normal.
IMPRESSION: No fracture or subluxation of the lumbar spine. Incidental note of
hemi transitional lumbosacral anatomy and mild disc space narrowing
at L5-S1.

## 2018-12-15 ENCOUNTER — Other Ambulatory Visit: Payer: Self-pay

## 2018-12-15 ENCOUNTER — Encounter (HOSPITAL_COMMUNITY): Payer: Self-pay

## 2018-12-15 DIAGNOSIS — M545 Low back pain: Secondary | ICD-10-CM | POA: Insufficient documentation

## 2018-12-15 DIAGNOSIS — M62830 Muscle spasm of back: Secondary | ICD-10-CM | POA: Diagnosis not present

## 2018-12-15 DIAGNOSIS — F17200 Nicotine dependence, unspecified, uncomplicated: Secondary | ICD-10-CM | POA: Diagnosis not present

## 2018-12-15 NOTE — ED Triage Notes (Addendum)
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

## 2018-12-16 ENCOUNTER — Emergency Department (HOSPITAL_COMMUNITY)
Admission: EM | Admit: 2018-12-16 | Discharge: 2018-12-16 | Disposition: A | Discharge status: Home / Self Care | Payer: No Typology Code available for payment source | Attending: Emergency Medicine | Admitting: Emergency Medicine

## 2018-12-16 DIAGNOSIS — M545 Low back pain, unspecified: Secondary | ICD-10-CM

## 2018-12-16 DIAGNOSIS — M62830 Muscle spasm of back: Secondary | ICD-10-CM

## 2018-12-16 MED ORDER — CYCLOBENZAPRINE HCL 10 MG PO TABS: 10.0000 mg | ORAL_TABLET | Freq: Three times a day (TID) | ORAL | 0 refills | Status: DC | PRN

## 2018-12-16 MED ORDER — CYCLOBENZAPRINE HCL 10 MG PO TABS
10.0000 mg | ORAL_TABLET | Freq: Once | ORAL | Status: AC
  Administered 2018-12-16: 10 mg via ORAL
  Filled 2018-12-16: qty 1

## 2018-12-16 MED ORDER — IBUPROFEN 600 MG PO TABS: 600.0000 mg | ORAL_TABLET | Freq: Three times a day (TID) | ORAL | 0 refills | Status: DC

## 2018-12-16 MED ORDER — IBUPROFEN 200 MG PO TABS
600.0000 mg | ORAL_TABLET | Freq: Once | ORAL | Status: AC
  Administered 2018-12-16: 600 mg via ORAL
  Filled 2018-12-16: qty 3

## 2018-12-16 NOTE — Discharge Instructions (Signed)
Rest the back for the next 2 days. Warm compresses as instructed. Take medications as directed.   If symptoms do not improve over the next 3-4 days follow up with orthopedics for further management.

## 2018-12-16 NOTE — ED Provider Notes (Signed)
[facility]-EMERGENCY DEPT Provider Note   CSN: [id number] Arrival date & time: 12/15/18  2100     History   Chief Complaint Chief Complaint  Patient presents with  . Back Pain    HPI Samuel Weber is a 33 y.o. male.     Patient to ED with complaint of low back pain since yesterday after lifting a heavy object and feeling a pop in the lower back. Since that time, he describes soreness with intermittent, sharp, stabbing pain with certain positions or movements. No abdominal pain. No urinary or bowel dysfunction. No radiation of the pain into the lower extremities, and no numbness or weakness. He has taken Tylenol for pain without relief.   The history is provided by the patient. No language interpreter was used.  Back Pain Associated symptoms: no abdominal pain, no fever, no numbness and no weakness     Past Medical History:  Diagnosis Date  . Heroin abuse ([facility])   . MRSA infection     Patient Active Problem List   Diagnosis Date Noted  . Overdose 07/08/2014  . Coma (HCC) 07/08/2014  . Acute respiratory failure (HCC) 07/08/2014  . Opioid overdose (HCC)   . Opioid dependence with withdrawal (HCC) 03/01/2014    Past Surgical History:  Procedure Laterality Date  . APPENDECTOMY          Home Medications    Prior to Admission medications   Medication Sig Start Date End Date Taking? Authorizing Provider  acetaminophen (TYLENOL) 500 MG tablet Take 1,000 mg by mouth every 6 (six) hours as needed for mild pain.    [provider]  doxycycline (VIBRAMYCIN) 100 MG capsule Take 1 capsule (100 mg total) by mouth 2 (two) times daily. 01/03/15   Servando Salinaossi, Catherine H, NP  ibuprofen (ADVIL,MOTRIN) 800 MG tablet Take 1 tablet (800 mg total) by mouth 3 (three) times daily. 01/03/15   Servando Salinaossi, Catherine H, NP  predniSONE (STERAPRED UNI-PAK 21 TAB) 10 MG (21) TBPK tablet Take 6 tablets on day 1 Take 5 tablets on day 2 Take 4 tablets on day 3 Take 3  tablets on day 4 Take 2 tablets on day 5 Take 1 tablet on day 6 11/02/14   Ruffian, III Kristine GarbeWilliam C, PA-C    Family History History reviewed. No pertinent family history.  Social History Social History   Tobacco Use  . Smoking status: Current Every Day Smoker    Packs/day: 0.50  Substance Use Topics  . Alcohol use: No  . Drug use: Yes    Types: IV, Cocaine, Marijuana    Comment: heroin     Allergies   Patient has no known allergies.   Review of Systems Review of Systems  Constitutional: Negative for chills and fever.  Gastrointestinal: Negative.  Negative for abdominal pain.  Genitourinary: Negative for enuresis.  Musculoskeletal: Positive for back pain.       See HPI  Skin: Negative.   Neurological: Negative.  Negative for weakness and numbness.     Physical Exam Updated Vital Signs BP 123/87 (BP Location: Left Arm)   Pulse 88   Temp 97.7 F (36.5 C) (Oral)   Resp 16   Ht 6\' 1"  (1.854 m)   Wt 69.6 kg   SpO2 99%   BMI 20.24 kg/m   Physical Exam Vitals signs and nursing note reviewed.  Constitutional:      Appearance: He is well-developed.  Neck:     Musculoskeletal: Normal range of motion.  Pulmonary:     Effort: Pulmonary effort is normal.  Abdominal:     Tenderness: There is no abdominal tenderness.  Musculoskeletal: Normal range of motion.     Comments: No swelling of the lower back. No discoloration. There is mild right paralumbar tenderness. FROM LE's without strength deficits.   Skin:    General: Skin is warm and dry.     Findings: No erythema.  Neurological:     General: No focal deficit present.     Mental Status: He is alert and oriented to person, place, and time.     Sensory: No sensory deficit.     Motor: No weakness.     Coordination: Coordination normal.     Gait: Gait normal.     Deep Tendon Reflexes: Reflexes normal.      ED Treatments / Results  Labs (all labs ordered are listed, but only abnormal results are displayed)  Labs Reviewed - No data to display  EKG    Radiology No results found.  Procedures Procedures (including critical care time)  Medications Ordered in ED Medications - No data to display   Initial Impression / Assessment and Plan / ED Course  I have reviewed the triage vital signs and the nursing notes.  Pertinent labs & imaging results that were available during my care of the patient were reviewed by me and considered in my medical decision making (see chart for details).        Patient to ED with c/o back pain after lifting heavy object. He describes muscular spasm type pain. There are no neurologic deficits on exam. He appears well.   Will treat with Flexeril and ibuprofen. He requests work note for 2 days which is appropriate.   Final Clinical Impressions(s) / ED Diagnoses   Final diagnoses:  None   1. Low back pain 2. Muscle spasm  ED Discharge Orders    None       Charlann Lange, PA-C 12/16/18 0228    Ward, Delice Bison, DO 12/16/18 [phone]

## 2018-12-16 NOTE — ED Notes (Signed)
Bed: [facility] Expected date:  Expected time:  Means of arrival:  Comments: 

## 2019-03-16 ENCOUNTER — Other Ambulatory Visit: Payer: Self-pay

## 2019-03-16 ENCOUNTER — Emergency Department (HOSPITAL_COMMUNITY)
Admission: EM | Admit: 2019-03-16 | Discharge: 2019-03-16 | Disposition: A | Discharge status: Home / Self Care | Payer: No Typology Code available for payment source | Attending: Emergency Medicine | Admitting: Emergency Medicine

## 2019-03-16 ENCOUNTER — Encounter (HOSPITAL_COMMUNITY): Payer: Self-pay

## 2019-03-16 DIAGNOSIS — Y929 Unspecified place or not applicable: Secondary | ICD-10-CM | POA: Insufficient documentation

## 2019-03-16 DIAGNOSIS — X500XXA Overexertion from strenuous movement or load, initial encounter: Secondary | ICD-10-CM | POA: Diagnosis not present

## 2019-03-16 DIAGNOSIS — S339XXA Sprain of unspecified parts of lumbar spine and pelvis, initial encounter: Secondary | ICD-10-CM | POA: Diagnosis not present

## 2019-03-16 DIAGNOSIS — Z79899 Other long term (current) drug therapy: Secondary | ICD-10-CM | POA: Diagnosis not present

## 2019-03-16 DIAGNOSIS — F17210 Nicotine dependence, cigarettes, uncomplicated: Secondary | ICD-10-CM | POA: Diagnosis not present

## 2019-03-16 DIAGNOSIS — Y999 Unspecified external cause status: Secondary | ICD-10-CM | POA: Insufficient documentation

## 2019-03-16 DIAGNOSIS — S335XXA Sprain of ligaments of lumbar spine, initial encounter: Secondary | ICD-10-CM

## 2019-03-16 DIAGNOSIS — Y939 Activity, unspecified: Secondary | ICD-10-CM | POA: Insufficient documentation

## 2019-03-16 DIAGNOSIS — S3992XA Unspecified injury of lower back, initial encounter: Secondary | ICD-10-CM | POA: Diagnosis present

## 2019-03-16 MED ORDER — PREDNISONE 20 MG PO TABS: 40.0000 mg | ORAL_TABLET | Freq: Every day | ORAL | 0 refills | Status: DC

## 2019-03-16 MED ORDER — METHOCARBAMOL 500 MG PO TABS: 500.0000 mg | ORAL_TABLET | Freq: Three times a day (TID) | ORAL | 0 refills | Status: DC | PRN

## 2019-03-16 NOTE — ED Triage Notes (Signed)
Pt reports lower back pain since last night, states he thinks he pulled a muscle in his back while working on a well. Pt ambulatory

## 2019-03-16 NOTE — ED Provider Notes (Signed)
[place] [facility] EMERGENCY DEPARTMENT Provider Note   CSN: [id number] Arrival date & time: 03/16/19  2030     History   Chief Complaint Chief Complaint  Patient presents with  . Back Pain    HPI Samuel Weber is a 33 y.o. male.     HPI Patient presents with low back pain.  Began around 2 days ago after he lifted a pump out of a while.  States he had some pain at the time but more severe a day later.  No numbness weakness.  No fevers or chills.  Former IV drug use but denies any currently.  No loss of bladder or bowel control.  States the pain is dull.  Does not really go down the legs.  No abdominal pain.  No other injury. Past Medical History:  Diagnosis Date  . Heroin abuse ([place])   . MRSA infection     Patient Active Problem List   Diagnosis Date Noted  . Overdose 07/08/2014  . Coma ([place]) 07/08/2014  . Acute respiratory failure ([place]) 07/08/2014  . Opioid overdose ([place])   . Opioid dependence with withdrawal ([place]) 03/01/2014    Past Surgical History:  Procedure Laterality Date  . APPENDECTOMY          Home Medications    Prior to Admission medications   Medication Sig Start Date End Date Taking? Authorizing Provider  acetaminophen (TYLENOL) 500 MG tablet Take 1,000 mg by mouth every 6 (six) hours as needed for mild pain.    [provider]  cyclobenzaprine (FLEXERIL) 10 MG tablet Take 1 tablet (10 mg total) by mouth 3 (three) times daily as needed for muscle spasms. 12/16/18   Charlann Lange, PA-C  doxycycline (VIBRAMYCIN) 100 MG capsule Take 1 capsule (100 mg total) by mouth 2 (two) times daily. 01/03/15   Nehemiah Settle, NP  ibuprofen (ADVIL) 600 MG tablet Take 1 tablet (600 mg total) by mouth 3 (three) times daily. 12/16/18   Charlann Lange, PA-C  methocarbamol (ROBAXIN) 500 MG tablet Take 1 tablet (500 mg total) by mouth every 8 (eight) hours as needed for muscle spasms. 03/16/19   Davonna Belling, MD  predniSONE (DELTASONE) 20 MG  tablet Take 2 tablets (40 mg total) by mouth daily. 03/16/19   Davonna Belling, MD    Family History No family history on file.  Social History Social History   Tobacco Use  . Smoking status: Current Every Day Smoker    Packs/day: 0.50  Substance Use Topics  . Alcohol use: No  . Drug use: Yes    Types: IV, Cocaine, Marijuana    Comment: heroin     Allergies   Patient has no known allergies.   Review of Systems Review of Systems  Constitutional: Negative for appetite change, chills and fever.  Respiratory: Negative for shortness of breath.   Cardiovascular: Negative for chest pain.  Gastrointestinal: Negative for abdominal pain.  Musculoskeletal: Positive for back pain.  Skin: Negative for rash.  Neurological: Negative for weakness and numbness.  Psychiatric/Behavioral: Negative for confusion.     Physical Exam Updated Vital Signs BP 135/83   Pulse (!) 53   Temp 98.2 F (36.8 C) (Oral)   Resp 18   SpO2 100%   Physical Exam Vitals signs and nursing note reviewed.  Neck:     Musculoskeletal: Neck supple.  Cardiovascular:     Rate and Rhythm: Normal rate.  Pulmonary:     Breath sounds: Normal breath sounds.  Abdominal:  Tenderness: There is no abdominal tenderness.  Musculoskeletal:     Right lower leg: No edema.     Left lower leg: No edema.     Comments: Some tenderness over left lower back.  No skin changes.  Good straight leg raise bilaterally.  Perineal sensation intact.  Skin:    Coloration: Skin is not jaundiced.     Findings: No bruising.  Neurological:     General: No focal deficit present.     Mental Status: He is alert.      ED Treatments / Results  Labs (all labs ordered are listed, but only abnormal results are displayed) Labs Reviewed - No data to display  EKG None  Radiology No results found.  Procedures Procedures (including critical care time)  Medications Ordered in ED Medications - No data to display   Initial  Impression / Assessment and Plan / ED Course  I have reviewed the triage vital signs and the nursing notes.  Pertinent labs & imaging results that were available during my care of the patient were reviewed by me and considered in my medical decision making (see chart for details).        Patient with low back pain after lifting something heavy.  Does have the red flag of former IV drug use but no fevers now.  Discussed with patient that this increases risk of infection or other pathology there.  I think however since it began acutely after lifting something heavy is more likely musculoskeletal.  Will treat with muscle relaxer and steroids although was given follow-up instructions and what to watch out for.  Will discharge home.  Final Clinical Impressions(s) / ED Diagnoses   Final diagnoses:  Low back sprain, initial encounter    ED Discharge Orders         Ordered    methocarbamol (ROBAXIN) 500 MG tablet  Every 8 hours PRN     03/16/19 2230    predniSONE (DELTASONE) 20 MG tablet  Daily     03/16/19 2230           Benjiman CorePickering, Shemuel Harkleroad, MD 03/16/19 2249

## 2019-04-27 ENCOUNTER — Emergency Department (HOSPITAL_COMMUNITY)
Admission: EM | Admit: 2019-04-27 | Discharge: 2019-04-27 | Disposition: A | Discharge status: Home / Self Care | Payer: No Typology Code available for payment source | Attending: Emergency Medicine | Admitting: Emergency Medicine

## 2019-04-27 ENCOUNTER — Encounter (HOSPITAL_COMMUNITY): Payer: Self-pay

## 2019-04-27 ENCOUNTER — Other Ambulatory Visit: Payer: Self-pay

## 2019-04-27 DIAGNOSIS — M545 Low back pain, unspecified: Secondary | ICD-10-CM

## 2019-04-27 DIAGNOSIS — F17210 Nicotine dependence, cigarettes, uncomplicated: Secondary | ICD-10-CM | POA: Insufficient documentation

## 2019-04-27 MED ORDER — LIDOCAINE 5 % EX PTCH: 1.0000 | MEDICATED_PATCH | CUTANEOUS | 0 refills | Status: DC

## 2019-04-27 NOTE — ED Provider Notes (Signed)
[facility] EMERGENCY DEPARTMENT Provider Note   CSN: [id number] Arrival date & time: 04/27/19  2120     History   Chief Complaint Chief Complaint  Patient presents with  . Back Pain    HPI Samuel Weber is a 33 y.o. male.     Patient presents to the emergency department with a chief complaint of low back pain.  He states that he strained his back while at work yesterday.  States that he was unable to go in to his shift today.  He denies any numbness, weakness, or tingling.  Denies any bowel or bladder incontinence.  Denies any fevers or chills.  History of IV substance abuse.  The history is provided by the patient. No language interpreter was used.    Past Medical History:  Diagnosis Date  . Heroin abuse ([place])   . MRSA infection     Patient Active Problem List   Diagnosis Date Noted  . Overdose 07/08/2014  . Coma ([place]) 07/08/2014  . Acute respiratory failure ([place]) 07/08/2014  . Opioid overdose ([place])   . Opioid dependence with withdrawal ([place]) 03/01/2014    Past Surgical History:  Procedure Laterality Date  . APPENDECTOMY          Home Medications    Prior to Admission medications   Medication Sig Start Date End Date Taking? Authorizing Provider  acetaminophen (TYLENOL) 500 MG tablet Take 1,000 mg by mouth every 6 (six) hours as needed for mild pain.    [provider]  cyclobenzaprine (FLEXERIL) 10 MG tablet Take 1 tablet (10 mg total) by mouth 3 (three) times daily as needed for muscle spasms. 12/16/18   Charlann Lange, PA-C  doxycycline (VIBRAMYCIN) 100 MG capsule Take 1 capsule (100 mg total) by mouth 2 (two) times daily. 01/03/15   Nehemiah Settle, NP  ibuprofen (ADVIL) 600 MG tablet Take 1 tablet (600 mg total) by mouth 3 (three) times daily. 12/16/18   Charlann Lange, PA-C  lidocaine (LIDODERM) 5 % Place 1 patch onto the skin daily. Remove & Discard patch within 12 hours or as directed by MD 04/27/19   Montine Circle, PA-C   methocarbamol (ROBAXIN) 500 MG tablet Take 1 tablet (500 mg total) by mouth every 8 (eight) hours as needed for muscle spasms. 03/16/19   Davonna Belling, MD  predniSONE (DELTASONE) 20 MG tablet Take 2 tablets (40 mg total) by mouth daily. 03/16/19   Davonna Belling, MD    Family History No family history on file.  Social History Social History   Tobacco Use  . Smoking status: Current Every Day Smoker    Packs/day: 0.50  Substance Use Topics  . Alcohol use: No  . Drug use: Yes    Types: IV, Cocaine, Marijuana    Comment: heroin     Allergies   Patient has no known allergies.   Review of Systems Review of Systems  All other systems reviewed and are negative.    Physical Exam Updated Vital Signs BP (!) 146/88 (BP Location: Left Arm)   Pulse 84   Temp 98.1 F (36.7 C) (Oral)   Resp 16   Ht 6\' 1"  (1.854 m)   Wt 79.4 kg   SpO2 100%   BMI 23.09 kg/m   Physical Exam Vitals signs and nursing note reviewed.  Constitutional:      Appearance: Normal appearance. He is well-developed.  HENT:     Head: Normocephalic and atraumatic.  Eyes:     Conjunctiva/sclera:  Conjunctivae normal.  Neck:     Musculoskeletal: Normal range of motion and neck supple.  Cardiovascular:     Rate and Rhythm: Normal rate and regular rhythm.     Heart sounds: No murmur.  Pulmonary:     Effort: Pulmonary effort is normal. No respiratory distress.     Breath sounds: Normal breath sounds.  Abdominal:     Palpations: Abdomen is soft.     Tenderness: There is no abdominal tenderness.  Musculoskeletal:     Comments: Mild lumbar paraspinal muscles tenderness to palpation, no bony CTLS spine tenderness, deformity, step-off, or crepitus  Skin:    General: Skin is warm and dry.     Comments: No erythema or evidence of infection, rash, or abscess  Neurological:     Mental Status: He is alert.     Comments: Pt is alert and oriented Speech is clear and goal oriented, follows commands Normal  5/5 strength lower extremities bilaterally including dorsiflexion and plantar flexion, strong and equal grip strength Sensation intact Great toe extension intact Moves extremities without ataxia, coordination intact Normal gait Normal balance No Clonus    Psychiatric:        Mood and Affect: Mood normal.        Behavior: Behavior normal.        Thought Content: Thought content normal.        Judgment: Judgment normal.      ED Treatments / Results  Labs (all labs ordered are listed, but only abnormal results are displayed) Labs Reviewed - No data to display  EKG None  Radiology No results found.  Procedures Procedures (including critical care time)  Medications Ordered in ED Medications - No data to display   Initial Impression / Assessment and Plan / ED Course  I have reviewed the triage vital signs and the nursing notes.  Pertinent labs & imaging results that were available during my care of the patient were reviewed by me and considered in my medical decision making (see chart for details).        Patient with back pain.    No neurological deficits and normal neuro exam.  Patient is ambulatory.  No loss of bowel or bladder control.  Doubt cauda equina.  Denies fever,  doubt epidural abscess or other lesion. Recommend back exercises, stretching, RICE, and will treat with lidoderm patches.  Consultants: none  Review of prior charts show prior visits for the same.  Encouraged the patient that there could be a need for additional workup and/or imaging such as MRI, if the symptoms do not resolve. Patient advised that if the back pain does not resolve, or radiates, this could progress to more serious conditions and is encouraged to follow-up with PCP or orthopedics within 2 weeks.     Final Clinical Impressions(s) / ED Diagnoses   Final diagnoses:  Acute bilateral low back pain without sciatica    ED Discharge Orders         Ordered    lidocaine (LIDODERM)  5 %  Every 24 hours     04/27/19 2259           [name], PA-C 04/27/19 2302    Charlynne Pander, MD 04/28/19 1334

## 2019-04-27 NOTE — ED Notes (Signed)
Patient Alert and oriented to baseline. Stable and ambulatory to baseline. Patient verbalized understanding of the discharge instructions.  Patient belongings were taken by the patient.   

## 2019-04-27 NOTE — ED Triage Notes (Signed)
Pt reports pulling a muscle in his lower back while at work. Pt seen here last week for the same. Pt ambulatory.

## 2021-08-26 ENCOUNTER — Other Ambulatory Visit: Payer: Self-pay

## 2021-08-26 ENCOUNTER — Encounter (HOSPITAL_COMMUNITY): Payer: Self-pay

## 2021-08-26 ENCOUNTER — Emergency Department (HOSPITAL_COMMUNITY): Payer: Commercial Managed Care - HMO

## 2021-08-26 ENCOUNTER — Emergency Department (HOSPITAL_COMMUNITY)
Admission: EM | Admit: 2021-08-26 | Discharge: 2021-08-26 | Disposition: A | Discharge status: Home / Self Care | Payer: Commercial Managed Care - HMO | Attending: Emergency Medicine | Admitting: Emergency Medicine

## 2021-08-26 DIAGNOSIS — S9032XA Contusion of left foot, initial encounter: Secondary | ICD-10-CM | POA: Diagnosis not present

## 2021-08-26 DIAGNOSIS — S99922A Unspecified injury of left foot, initial encounter: Secondary | ICD-10-CM | POA: Diagnosis present

## 2021-08-26 DIAGNOSIS — W25XXXA Contact with sharp glass, initial encounter: Secondary | ICD-10-CM | POA: Insufficient documentation

## 2021-08-26 DIAGNOSIS — S90122A Contusion of left lesser toe(s) without damage to nail, initial encounter: Secondary | ICD-10-CM

## 2021-08-26 MED ORDER — IBUPROFEN 600 MG PO TABS: 600.0000 mg | ORAL_TABLET | Freq: Four times a day (QID) | ORAL | 0 refills | Status: DC | PRN

## 2021-08-26 NOTE — ED Provider Notes (Addendum)
?[facility]-EMERGENCY DEPT ?Provider Note ? ? ?CSN: [id number] ?Arrival date & time: 08/26/21  1534 ? ?  ? ?History ? ?Chief Complaint  ?Patient presents with  ? Toe Pain  ?  Dropped a glass vase on his left third toe last night. Bruised and purple in color. Unable to move toe. Hurts to walk. Rates pain 8/10.   ? ? ?Samuel Weber is a 36 y.o. male. ? ?The history is provided by the patient. No language interpreter was used.  ? ? 36 year old male significant hx of polysubstance abuse presenting c/o toe pain.  Pt report he accidentally dropped a glass vase onto his left third toe and injured it.  Report sharp 8/10 non radiating pain increase with ambulation.  Denies numbness or fever.  No other injury.  No treatment tried. ? ?Home Medications ?Prior to Admission medications   ?Medication Sig Start Date End Date Taking? Authorizing Provider  ?acetaminophen (TYLENOL) 500 MG tablet Take 1,000 mg by mouth every 6 (six) hours as needed for mild pain.    [provider]  ?cyclobenzaprine (FLEXERIL) 10 MG tablet Take 1 tablet (10 mg total) by mouth 3 (three) times daily as needed for muscle spasms. 12/16/18   [name], PA-C  ?doxycycline (VIBRAMYCIN) 100 MG capsule Take 1 capsule (100 mg total) by mouth 2 (two) times daily. 01/03/15   [name], NP  ?ibuprofen (ADVIL) 600 MG tablet Take 1 tablet (600 mg total) by mouth 3 (three) times daily. 12/16/18   [name], PA-C  ?lidocaine (LIDODERM) 5 % Place 1 patch onto the skin daily. Remove & Discard patch within 12 hours or as directed by MD 04/27/19   [name], PA-C  ?methocarbamol (ROBAXIN) 500 MG tablet Take 1 tablet (500 mg total) by mouth every 8 (eight) hours as needed for muscle spasms. 03/16/19   [name], MD  ?predniSONE (DELTASONE) 20 MG tablet Take 2 tablets (40 mg total) by mouth daily. 03/16/19   [name], MD  ?   ? ?Allergies    ?Patient has no known allergies.   ? ?Review of Systems   ?Review of  Systems  ?All other systems reviewed and are negative. ? ?Physical Exam ?Updated Vital Signs ?BP 135/80 (BP Location: Left Arm)   Pulse 80   Temp 98.2 ?F (36.8 ?C) (Oral)   Resp 18   Ht 6\' 1"  (1.854 m)   Wt 90.7 kg   SpO2 96%   BMI 26.39 kg/m?  ?Physical Exam ?Vitals and nursing note reviewed.  ?Constitutional:   ?   General: He is not in acute distress. ?   Appearance: He is well-developed.  ?HENT:  ?   Head: Atraumatic.  ?Eyes:  ?   Conjunctiva/sclera: Conjunctivae normal.  ?Musculoskeletal:     ?   General: No signs of injury (L 3rd toe: toe is ecchymotic and edematous with ttp, no nail involvement, brisk cap refill.  no deformity).  ?   Cervical back: Neck supple.  ?Skin: ?   Findings: No rash.  ?Neurological:  ?   Mental Status: He is alert.  ? ? ?ED Results / Procedures / Treatments   ?Labs ?(all labs ordered are listed, but only abnormal results are displayed) ?Labs Reviewed - No data to display ? ?EKG ?None ? ?Radiology ?DG Foot Complete Left ? ?Result Date: 08/26/2021 ?CLINICAL DATA:  Toe injury. EXAM: LEFT FOOT - COMPLETE 3+ VIEW COMPARISON:  None. FINDINGS: There is no evidence of fracture  or dislocation. There is no evidence of arthropathy or other focal bone abnormality. Soft tissues are unremarkable. IMPRESSION: Negative. Electronically Signed   By: [name] M.D.   On: 08/26/2021 16:21   ? ?Procedures ?Procedures  ? ? ?Medications Ordered in ED ?Medications - No data to display ? ?ED Course/ Medical Decision Making/ A&P ?  ?                        ?Medical Decision Making ?Amount and/or Complexity of Data Reviewed ?Radiology: ordered and independent interpretation performed. Decision-making details documented in ED Course. ? ?Risk ?Prescription drug management. ? ? ?BP 135/80 (BP Location: Left Arm)   Pulse 80   Temp 98.2 ?F (36.8 ?C) (Oral)   Resp 18   Ht 6\' 1"  (1.854 m)   Wt 90.7 kg   SpO2 96%   BMI 26.39 kg/m?  ? ?4:28 PM ?Pt here with L 3rd toe injury when he accidentally dropped a  vase on top of it.  On exam pt has an ecchymotic and edematous L 3rd toe without nail involvement.  No laceration.  Xray obtain, independently visualized and interpreted by me and is negative for fx.  Will ACE wrap, RICE therapy discussed.  Work note provided.   ? ? ? ? ? ? ? ?Final Clinical Impression(s) / ED Diagnoses ?Final diagnoses:  ?Contusion of lesser toe of left foot without damage to nail, initial encounter  ? ? ?Rx / DC Orders ?ED Discharge Orders   ? ?      Ordered  ?  ibuprofen (ADVIL) 600 MG tablet  Every 6 hours PRN       ? 08/26/21 1630  ? ?  ?  ? ?  ? ? ?  ?10/26/21, PA-C ?08/26/21 1631 ? ?  ?10/26/21, PA-C ?08/26/21 1633 ? ?  ?10/26/21, MD ?08/27/21 [phone] ? ?

## 2021-08-26 NOTE — ED Triage Notes (Signed)
Dropped a glass vase on his left third toe last night. Bruised and purple in color. Unable to move toe. Hurts to walk. Rates pain 8/10.  ?

## 2021-11-17 ENCOUNTER — Ambulatory Visit (HOSPITAL_COMMUNITY)
Admission: EM | Admit: 2021-11-17 | Discharge: 2021-11-17 | Disposition: A | Discharge status: Home / Self Care | Payer: 59

## 2021-11-17 ENCOUNTER — Observation Stay (HOSPITAL_BASED_OUTPATIENT_CLINIC_OR_DEPARTMENT_OTHER): Payer: 59 | Admitting: Anesthesiology

## 2021-11-17 ENCOUNTER — Encounter (HOSPITAL_COMMUNITY): Payer: Self-pay | Admitting: *Deleted

## 2021-11-17 ENCOUNTER — Observation Stay (HOSPITAL_COMMUNITY): Payer: 59 | Admitting: Anesthesiology

## 2021-11-17 ENCOUNTER — Emergency Department (HOSPITAL_COMMUNITY): Payer: 59

## 2021-11-17 ENCOUNTER — Encounter (HOSPITAL_COMMUNITY)
Admission: EM | Discharge status: Against Medical Advice | Payer: Self-pay | Source: Home / Self Care | Attending: Emergency Medicine

## 2021-11-17 ENCOUNTER — Other Ambulatory Visit: Payer: Self-pay

## 2021-11-17 ENCOUNTER — Observation Stay (HOSPITAL_COMMUNITY)
Admission: EM | Admit: 2021-11-17 | Discharge: 2021-11-17 | Discharge status: Against Medical Advice | Payer: 59 | Attending: Internal Medicine | Admitting: Internal Medicine

## 2021-11-17 ENCOUNTER — Encounter (HOSPITAL_COMMUNITY): Payer: Self-pay

## 2021-11-17 DIAGNOSIS — L02511 Cutaneous abscess of right hand: Secondary | ICD-10-CM

## 2021-11-17 DIAGNOSIS — L02413 Cutaneous abscess of right upper limb: Secondary | ICD-10-CM | POA: Diagnosis not present

## 2021-11-17 DIAGNOSIS — Z720 Tobacco use: Secondary | ICD-10-CM

## 2021-11-17 DIAGNOSIS — B1920 Unspecified viral hepatitis C without hepatic coma: Secondary | ICD-10-CM

## 2021-11-17 DIAGNOSIS — M7989 Other specified soft tissue disorders: Secondary | ICD-10-CM | POA: Diagnosis not present

## 2021-11-17 DIAGNOSIS — L02519 Cutaneous abscess of unspecified hand: Secondary | ICD-10-CM

## 2021-11-17 DIAGNOSIS — F17210 Nicotine dependence, cigarettes, uncomplicated: Secondary | ICD-10-CM | POA: Diagnosis not present

## 2021-11-17 DIAGNOSIS — Z23 Encounter for immunization: Secondary | ICD-10-CM | POA: Insufficient documentation

## 2021-11-17 DIAGNOSIS — L089 Local infection of the skin and subcutaneous tissue, unspecified: Secondary | ICD-10-CM | POA: Diagnosis not present

## 2021-11-17 DIAGNOSIS — Z79899 Other long term (current) drug therapy: Secondary | ICD-10-CM | POA: Insufficient documentation

## 2021-11-17 DIAGNOSIS — F1110 Opioid abuse, uncomplicated: Secondary | ICD-10-CM

## 2021-11-17 DIAGNOSIS — R9431 Abnormal electrocardiogram [ECG] [EKG]: Secondary | ICD-10-CM | POA: Diagnosis not present

## 2021-11-17 HISTORY — DX: Opioid dependence, uncomplicated: F11.20

## 2021-11-17 HISTORY — DX: Poisoning by other opioids, accidental (unintentional), initial encounter: T40.2X1A

## 2021-11-17 HISTORY — PX: I & D EXTREMITY: SHX5045

## 2021-11-17 HISTORY — DX: Unspecified viral hepatitis C without hepatic coma: B19.20

## 2021-11-17 LAB — CBC
HCT: 42.6 % (ref 39.0–52.0)
Hemoglobin: 13.9 g/dL (ref 13.0–17.0)
MCH: 32 pg (ref 26.0–34.0)
MCHC: 32.6 g/dL (ref 30.0–36.0)
MCV: 97.9 fL (ref 80.0–100.0)
Platelets: 231 10*3/uL (ref 150–400)
RBC: 4.35 MIL/uL (ref 4.22–5.81)
RDW: 12.2 % (ref 11.5–15.5)
WBC: 10.9 10*3/uL — ABNORMAL HIGH (ref 4.0–10.5)
nRBC: 0 % (ref 0.0–0.2)

## 2021-11-17 LAB — BASIC METABOLIC PANEL
Anion gap: 9 (ref 5–15)
BUN: 7 mg/dL (ref 6–20)
CO2: 23 mmol/L (ref 22–32)
Calcium: 8.6 mg/dL — ABNORMAL LOW (ref 8.9–10.3)
Chloride: 106 mmol/L (ref 98–111)
Creatinine, Ser: 0.92 mg/dL (ref 0.61–1.24)
GFR, Estimated: >60 mL/min (ref >60)
Glucose, Bld: 105 mg/dL — ABNORMAL HIGH (ref 70–99)
Potassium: 3.9 mmol/L (ref 3.5–5.1)
Sodium: 138 mmol/L (ref 135–145)

## 2021-11-17 LAB — APTT: aPTT: 34 seconds (ref 24–36)

## 2021-11-17 LAB — PROTIME-INR
INR: 1.1 (ref 0.8–1.2)
Prothrombin Time: 13.7 seconds (ref 11.4–15.2)

## 2021-11-17 LAB — LACTIC ACID, PLASMA: Lactic Acid, Venous: 1.4 mmol/L (ref 0.5–1.9)

## 2021-11-17 SURGERY — IRRIGATION AND DEBRIDEMENT EXTREMITY
Anesthesia: General | Site: Hand | Laterality: Right

## 2021-11-17 MED ORDER — VANCOMYCIN HCL 1750 MG/350ML IV SOLN
1750.0000 mg | Freq: Two times a day (BID) | INTRAVENOUS | Status: DC
  Filled 2021-11-17: qty 350

## 2021-11-17 MED ORDER — 0.9 % SODIUM CHLORIDE (POUR BTL) OPTIME
TOPICAL | Status: DC | PRN
  Administered 2021-11-17: 1000 mL

## 2021-11-17 MED ORDER — PROPOFOL 10 MG/ML IV BOLUS
INTRAVENOUS | Status: DC | PRN
  Administered 2021-11-17: 200 mg via INTRAVENOUS

## 2021-11-17 MED ORDER — BUPIVACAINE HCL (PF) 0.25 % IJ SOLN
INTRAMUSCULAR | Status: AC
  Filled 2021-11-17: qty 30

## 2021-11-17 MED ORDER — DEXAMETHASONE SODIUM PHOSPHATE 10 MG/ML IJ SOLN
INTRAMUSCULAR | Status: DC | PRN
Start: 2021-11-17 — End: 2021-11-17
  Administered 2021-11-17: 10 mg via INTRAVENOUS

## 2021-11-17 MED ORDER — PROPOFOL 10 MG/ML IV BOLUS
INTRAVENOUS | Status: AC
  Filled 2021-11-17: qty 20

## 2021-11-17 MED ORDER — LACTATED RINGERS IV SOLN: INTRAVENOUS | Status: DC

## 2021-11-17 MED ORDER — LIDOCAINE 2% (20 MG/ML) 5 ML SYRINGE
INTRAMUSCULAR | Status: DC | PRN
  Administered 2021-11-17: 80 mg via INTRAVENOUS

## 2021-11-17 MED ORDER — LACTATED RINGERS IV SOLN: INTRAVENOUS | Status: DC | PRN

## 2021-11-17 MED ORDER — ONDANSETRON HCL 4 MG/2ML IJ SOLN: 4.0000 mg | Freq: Once | INTRAMUSCULAR | Status: DC | PRN

## 2021-11-17 MED ORDER — ACETAMINOPHEN 10 MG/ML IV SOLN
INTRAVENOUS | Status: DC | PRN
  Administered 2021-11-17: 1000 mg via INTRAVENOUS

## 2021-11-17 MED ORDER — MIDAZOLAM HCL 2 MG/2ML IJ SOLN
INTRAMUSCULAR | Status: DC | PRN
  Administered 2021-11-17: 2 mg via INTRAVENOUS

## 2021-11-17 MED ORDER — SUCCINYLCHOLINE CHLORIDE 200 MG/10ML IV SOSY
PREFILLED_SYRINGE | INTRAVENOUS | Status: AC
  Filled 2021-11-17: qty 20

## 2021-11-17 MED ORDER — BUPIVACAINE HCL (PF) 0.25 % IJ SOLN
INTRAMUSCULAR | Status: DC | PRN
  Administered 2021-11-17: 10 mL

## 2021-11-17 MED ORDER — VANCOMYCIN HCL IN DEXTROSE 1-5 GM/200ML-% IV SOLN: 1000.0000 mg | Freq: Once | INTRAVENOUS | Status: DC

## 2021-11-17 MED ORDER — EPHEDRINE 5 MG/ML INJ
INTRAVENOUS | Status: AC
  Filled 2021-11-17: qty 5

## 2021-11-17 MED ORDER — SUCCINYLCHOLINE CHLORIDE 200 MG/10ML IV SOSY
PREFILLED_SYRINGE | INTRAVENOUS | Status: DC | PRN
  Administered 2021-11-17: 100 mg via INTRAVENOUS

## 2021-11-17 MED ORDER — FENTANYL CITRATE (PF) 250 MCG/5ML IJ SOLN
INTRAMUSCULAR | Status: AC
  Filled 2021-11-17: qty 5

## 2021-11-17 MED ORDER — SODIUM CHLORIDE 0.9 % IV SOLN
2.0000 g | Freq: Three times a day (TID) | INTRAVENOUS | Status: DC
  Administered 2021-11-17: 2 g via INTRAVENOUS
  Filled 2021-11-17: qty 12.5

## 2021-11-17 MED ORDER — FENTANYL CITRATE (PF) 250 MCG/5ML IJ SOLN
INTRAMUSCULAR | Status: DC | PRN
  Administered 2021-11-17: 50 ug via INTRAVENOUS

## 2021-11-17 MED ORDER — SODIUM CHLORIDE 0.9 % IR SOLN
Status: DC | PRN
  Administered 2021-11-17: 3000 mL

## 2021-11-17 MED ORDER — SUGAMMADEX SODIUM 200 MG/2ML IV SOLN
INTRAVENOUS | Status: DC | PRN
  Administered 2021-11-17: 200 mg via INTRAVENOUS

## 2021-11-17 MED ORDER — MIDAZOLAM HCL 2 MG/2ML IJ SOLN
INTRAMUSCULAR | Status: AC
Start: 2021-11-17 — End: ?
  Filled 2021-11-17: qty 2

## 2021-11-17 MED ORDER — FENTANYL CITRATE (PF) 100 MCG/2ML IJ SOLN: 25.0000 ug | INTRAMUSCULAR | Status: DC | PRN

## 2021-11-17 MED ORDER — LIDOCAINE HCL (PF) 1 % IJ SOLN
INTRAMUSCULAR | Status: AC
  Filled 2021-11-17: qty 30

## 2021-11-17 MED ORDER — ONDANSETRON HCL 4 MG/2ML IJ SOLN
INTRAMUSCULAR | Status: DC | PRN
  Administered 2021-11-17: 4 mg via INTRAVENOUS

## 2021-11-17 MED ORDER — ACETAMINOPHEN 10 MG/ML IV SOLN
INTRAVENOUS | Status: AC
  Filled 2021-11-17: qty 100

## 2021-11-17 MED ORDER — VANCOMYCIN HCL IN DEXTROSE 1-5 GM/200ML-% IV SOLN
1000.0000 mg | Freq: Once | INTRAVENOUS | Status: AC
  Administered 2021-11-17: 1000 mg via INTRAVENOUS
  Filled 2021-11-17: qty 200

## 2021-11-17 MED ORDER — ROCURONIUM BROMIDE 10 MG/ML (PF) SYRINGE
PREFILLED_SYRINGE | INTRAVENOUS | Status: DC | PRN
  Administered 2021-11-17: 50 mg via INTRAVENOUS

## 2021-11-17 MED ORDER — TETANUS-DIPHTH-ACELL PERTUSSIS 5-2.5-18.5 LF-MCG/0.5 IM SUSY
0.5000 mL | PREFILLED_SYRINGE | Freq: Once | INTRAMUSCULAR | Status: AC
  Administered 2021-11-17: 0.5 mL via INTRAMUSCULAR
  Filled 2021-11-17: qty 0.5

## 2021-11-17 SURGICAL SUPPLY — 65 items
ADAPTER CATH SYR TO TUBING 38M (ADAPTER) ×2 IMPLANT
ADPR CATH LL SYR 3/32 TPR (ADAPTER) ×1
BAG COUNTER SPONGE SURGICOUNT (BAG) ×2 IMPLANT
BAG SPNG CNTER NS LX DISP (BAG) ×1
BNDG CMPR 9X4 STRL LF SNTH (GAUZE/BANDAGES/DRESSINGS)
BNDG COHESIVE 2X5 TAN STRL LF (GAUZE/BANDAGES/DRESSINGS) IMPLANT
BNDG ELASTIC 3X5.8 VLCR STR LF (GAUZE/BANDAGES/DRESSINGS) ×2 IMPLANT
BNDG ELASTIC 4X5.8 VLCR STR LF (GAUZE/BANDAGES/DRESSINGS) ×2 IMPLANT
BNDG ESMARK 4X9 LF (GAUZE/BANDAGES/DRESSINGS) IMPLANT
BNDG GAUZE ELAST 4 BULKY (GAUZE/BANDAGES/DRESSINGS) ×2 IMPLANT
CANNULA VESSEL 3MM 2 BLNT TIP (CANNULA) IMPLANT
CORD BIPOLAR FORCEPS 12FT (ELECTRODE) ×2 IMPLANT
COVER SURGICAL LIGHT HANDLE (MISCELLANEOUS) ×2 IMPLANT
CUFF TOURN SGL QUICK 18X4 (TOURNIQUET CUFF) IMPLANT
CUFF TOURN SGL QUICK 24 (TOURNIQUET CUFF)
CUFF TRNQT CYL 24X4X16.5-23 (TOURNIQUET CUFF) IMPLANT
DECANTER SPIKE VIAL GLASS SM (MISCELLANEOUS) ×2 IMPLANT
DRAIN PENROSE 1/4X12 LTX STRL (WOUND CARE) IMPLANT
DRSG PAD ABDOMINAL 8X10 ST (GAUZE/BANDAGES/DRESSINGS) ×4 IMPLANT
GAUZE PACKING IODOFORM 1/2INX (GAUZE/BANDAGES/DRESSINGS) IMPLANT
GAUZE PACKING IODOFORM 1/2INX5 (GAUZE/BANDAGES/DRESSINGS) ×1
GAUZE PAD ABD 8X10 STRL (GAUZE/BANDAGES/DRESSINGS) ×1 IMPLANT
GAUZE SPONGE 4X4 12PLY STRL (GAUZE/BANDAGES/DRESSINGS) ×2 IMPLANT
GAUZE SPONGE 4X4 12PLY STRL LF (GAUZE/BANDAGES/DRESSINGS) ×1 IMPLANT
GAUZE XEROFORM 1X8 LF (GAUZE/BANDAGES/DRESSINGS) ×2 IMPLANT
GLOVE BIO SURGEON STRL SZ7.5 (GLOVE) ×2 IMPLANT
GLOVE BIOGEL PI IND STRL 8 (GLOVE) ×1 IMPLANT
GLOVE BIOGEL PI IND STRL 8.5 (GLOVE) IMPLANT
GLOVE BIOGEL PI INDICATOR 8 (GLOVE) ×1
GLOVE BIOGEL PI INDICATOR 8.5 (GLOVE)
GLOVE BIOGEL PI ORTHO PRO SZ8 (GLOVE)
GLOVE PI ORTHO PRO STRL SZ8 (GLOVE) IMPLANT
GLOVE SURG ORTHO 8.0 STRL STRW (GLOVE) IMPLANT
GOWN STRL REUS W/ TWL LRG LVL3 (GOWN DISPOSABLE) ×1 IMPLANT
GOWN STRL REUS W/ TWL XL LVL3 (GOWN DISPOSABLE) ×1 IMPLANT
GOWN STRL REUS W/TWL LRG LVL3 (GOWN DISPOSABLE) ×2
GOWN STRL REUS W/TWL XL LVL3 (GOWN DISPOSABLE) ×2
KIT BASIN OR (CUSTOM PROCEDURE TRAY) ×2 IMPLANT
KIT TURNOVER KIT B (KITS) ×2 IMPLANT
LOOP VESSEL MAXI BLUE (MISCELLANEOUS) IMPLANT
MANIFOLD NEPTUNE II (INSTRUMENTS) IMPLANT
NDL HYPO 25X1 1.5 SAFETY (NEEDLE) IMPLANT
NEEDLE HYPO 25X1 1.5 SAFETY (NEEDLE) IMPLANT
NS IRRIG 1000ML POUR BTL (IV SOLUTION) ×2 IMPLANT
PACK ORTHO EXTREMITY (CUSTOM PROCEDURE TRAY) ×2 IMPLANT
PAD ARMBOARD 7.5X6 YLW CONV (MISCELLANEOUS) ×4 IMPLANT
PAD CAST 3X4 CTTN HI CHSV (CAST SUPPLIES) IMPLANT
PADDING CAST COTTON 3X4 STRL (CAST SUPPLIES) ×2
SET CYSTO W/LG BORE CLAMP LF (SET/KITS/TRAYS/PACK) IMPLANT
SOL PREP POV-IOD 4OZ 10% (MISCELLANEOUS) ×4 IMPLANT
SPLINT PLASTER EXTRA FAST 3X15 (CAST SUPPLIES) ×1
SPLINT PLASTER GYPS XFAST 3X15 (CAST SUPPLIES) IMPLANT
SPONGE T-LAP 4X18 ~~LOC~~+RFID (SPONGE) ×2 IMPLANT
SUT ETHILON 4 0 P 3 18 (SUTURE) IMPLANT
SUT ETHILON 4 0 PS 2 18 (SUTURE) IMPLANT
SUT MON AB 5-0 P3 18 (SUTURE) IMPLANT
SWAB COLLECTION DEVICE MRSA (MISCELLANEOUS) IMPLANT
SWAB CULTURE ESWAB REG 1ML (MISCELLANEOUS) IMPLANT
SYR 20ML LL LF (SYRINGE) ×2 IMPLANT
SYR CONTROL 10ML LL (SYRINGE) IMPLANT
TOWEL GREEN STERILE (TOWEL DISPOSABLE) ×2 IMPLANT
TUBE CONNECTING 12X1/4 (SUCTIONS) ×2 IMPLANT
TUBE FEEDING ENTERAL 5FR 16IN (TUBING) IMPLANT
UNDERPAD 30X36 HEAVY ABSORB (UNDERPADS AND DIAPERS) ×2 IMPLANT
YANKAUER SUCT BULB TIP NO VENT (SUCTIONS) ×2 IMPLANT

## 2021-11-17 NOTE — ED Provider Notes (Signed)
[facility] EMERGENCY DEPARTMENT Provider Note   CSN: [id number] Arrival date & time: 11/17/21  1609     History  Chief Complaint  Patient presents with   abscess    Samuel Weber is a 36 y.o. male.  36 year old male with history of IV drug abuse presents with right hand swelling times several days.  Went to urgent care and was sent here for evaluation.  States that he has been using IV drugs into that hand.  Has had subjective fevers.  Has been taking a friend's antibiotic but is unsure what it is.  Began to notice red streaks going up his arm gradually.  No other treatments used prior to arrival      Home Medications Prior to Admission medications   Medication Sig Start Date End Date Taking? Authorizing Provider  acetaminophen (TYLENOL) 500 MG tablet Take 1,000 mg by mouth every 6 (six) hours as needed for mild pain.    [provider]  cyclobenzaprine (FLEXERIL) 10 MG tablet Take 1 tablet (10 mg total) by mouth 3 (three) times daily as needed for muscle spasms. 12/16/18   [name], PA-C  doxycycline (VIBRAMYCIN) 100 MG capsule Take 1 capsule (100 mg total) by mouth 2 (two) times daily. 01/03/15   [name], NP  ibuprofen (ADVIL) 600 MG tablet Take 1 tablet (600 mg total) by mouth every 6 (six) hours as needed for mild pain or moderate pain. 08/26/21   Fayrene Helper, PA-C  lidocaine (LIDODERM) 5 % Place 1 patch onto the skin daily. Remove & Discard patch within 12 hours or as directed by MD 04/27/19   [name], PA-C  methocarbamol (ROBAXIN) 500 MG tablet Take 1 tablet (500 mg total) by mouth every 8 (eight) hours as needed for muscle spasms. 03/16/19   [name], MD  predniSONE (DELTASONE) 20 MG tablet Take 2 tablets (40 mg total) by mouth daily. 03/16/19   [name], MD      Allergies    Patient has no known allergies.    Review of Systems   Review of Systems  All other systems reviewed and are negative.  Physical  Exam Updated Vital Signs BP (!) 153/91   Pulse 91   Temp 98.6 F (37 C)   Resp (!) 22   Ht 1.854 m (6\' 1" )   Wt 90.7 kg   SpO2 99%   BMI 26.38 kg/m  Physical Exam Vitals and nursing note reviewed.  Constitutional:      General: He is not in acute distress.    Appearance: Normal appearance. He is well-developed. He is not toxic-appearing.  HENT:     Head: Normocephalic and atraumatic.  Eyes:     General: Lids are normal.     Conjunctiva/sclera: Conjunctivae normal.     Pupils: Pupils are equal, round, and reactive to light.  Neck:     Thyroid: No thyroid mass.     Trachea: No tracheal deviation.  Cardiovascular:     Rate and Rhythm: Normal rate and regular rhythm.     Heart sounds: Normal heart sounds. No murmur heard.   No gallop.  Pulmonary:     Effort: Pulmonary effort is normal. No respiratory distress.     Breath sounds: Normal breath sounds. No stridor. No decreased breath sounds, wheezing, rhonchi or rales.  Abdominal:     General: There is no distension.     Palpations: Abdomen is soft.     Tenderness: There is no abdominal  tenderness. There is no rebound.  Musculoskeletal:        General: No tenderness. Normal range of motion.       Arms:     Cervical back: Normal range of motion and neck supple.  Skin:    General: Skin is warm and dry.     Findings: No abrasion or rash.  Neurological:     Mental Status: He is alert and oriented to person, place, and time. Mental status is at baseline.     GCS: GCS eye subscore is 4. GCS verbal subscore is 5. GCS motor subscore is 6.     Cranial Nerves: No cranial nerve deficit.     Sensory: No sensory deficit.     Motor: Motor function is intact.  Psychiatric:        Attention and Perception: Attention normal.        Speech: Speech normal.        Behavior: Behavior normal.    ED Results / Procedures / Treatments   Labs (all labs ordered are listed, but only abnormal results are displayed) Labs Reviewed  CULTURE,  BLOOD (ROUTINE X 2)  CULTURE, BLOOD (ROUTINE X 2)  CBC  BASIC METABOLIC PANEL  LACTIC ACID, PLASMA  LACTIC ACID, PLASMA  PROTIME-INR  APTT    EKG None  Radiology DG Wrist Complete Right  Result Date: 11/17/2021 CLINICAL DATA:  IV drug abuse, erythema and swelling EXAM: RIGHT HAND - COMPLETE 3+ VIEW; RIGHT WRIST - COMPLETE 3+ VIEW COMPARISON:  None Available. FINDINGS: Right hand: Frontal, oblique, and lateral views are obtained. There is extensive soft tissue swelling greatest throughout the dorsum of the hand. No subcutaneous gas or radiopaque foreign body. There are no acute or destructive bony lesions. Joint spaces are well preserved. Right wrist: Frontal, oblique, lateral views are obtained. There is diffuse soft tissue swelling. No subcutaneous gas or radiopaque foreign body. No acute or destructive bony lesions. Joint spaces are well preserved. IMPRESSION: 1. Extensive soft tissue swelling of the hand and wrist. No subcutaneous gas or radiopaque foreign body. 2. No acute or destructive bony lesions within the right hand or wrist. Electronically Signed   By: Sharlet SalinaMichael  Brown M.D.   On: 11/17/2021 17:10   DG Hand Complete Right  Result Date: 11/17/2021 CLINICAL DATA:  IV drug abuse, erythema and swelling EXAM: RIGHT HAND - COMPLETE 3+ VIEW; RIGHT WRIST - COMPLETE 3+ VIEW COMPARISON:  None Available. FINDINGS: Right hand: Frontal, oblique, and lateral views are obtained. There is extensive soft tissue swelling greatest throughout the dorsum of the hand. No subcutaneous gas or radiopaque foreign body. There are no acute or destructive bony lesions. Joint spaces are well preserved. Right wrist: Frontal, oblique, lateral views are obtained. There is diffuse soft tissue swelling. No subcutaneous gas or radiopaque foreign body. No acute or destructive bony lesions. Joint spaces are well preserved. IMPRESSION: 1. Extensive soft tissue swelling of the hand and wrist. No subcutaneous gas or radiopaque  foreign body. 2. No acute or destructive bony lesions within the right hand or wrist. Electronically Signed   By: Sharlet SalinaMichael  Brown M.D.   On: 11/17/2021 17:10    Procedures Procedures    Medications Ordered in ED Medications  vancomycin (VANCOCIN) IVPB 1000 mg/200 mL premix (has no administration in time range)  tetanus & diphtheria toxoids (adult) (TENIVAC) injection 0.5 mL (has no administration in time range)    ED Course/ Medical Decision Making/ A&P  Medical Decision Making Risk Prescription drug management.   Patient with abscess to his right hand on the dorsal surface.  Consider compartment syndrome of the hand although feel less likely.  He is neurologically intact at his digits.  Considered necrotizing fasciitis but does not have any bulla.  Will be started on IV vancomycin for the cellulitis component of his infection.  Discussed with Dr. Merlyn Lot from hand surgery and he will take the patient to the OR.  Request hospitalist admit and will contact them.        Final Clinical Impression(s) / ED Diagnoses Final diagnoses:  None    Rx / DC Orders ED Discharge Orders     None         Lorre Nick, MD 11/17/21 1726

## 2021-11-17 NOTE — H&P (Signed)
History and Physical    Samuel Weber KPV:[id number] DOB: [birth year] DOA: 11/17/2021  DOS: the patient was seen and examined on 11/17/2021  PCP: [facility]   Patient coming from: Home  I have personally briefly reviewed patient's old medical records in [facility]  CC: right hand swelling HPI: 36 year old white male history of chronic narcotic abuse, IV drug use presents to the ER today with swelling of his right hand since Saturday, May 20.  Patient abuses IV narcotics.  His drug of choice is fentanyl.  He states that he thinks he may have missed the vein and injected outside of his vein.  He has had increasing swelling and redness of the hand.  Increasing pain.  Presented to the ER today.  Vitals temp 98.6 heart rate 91 blood pressure 153/91 satting 99% on room air.  Labs are reassuring.  White count 10.9.  Blood cultures were obtained.  EDP discussed case with hand surgery.  They are planning taken the patient to the operating room tonight for a washout.  Patient started on IV vancomycin.  [facility] hospitalist contacted for admission.   ED Course: labs reassuring. Started on IV vancomycin. EDP consulted hand surgery. Planning on I&D and washout in OR tonight.  Review of Systems:  Review of Systems  HENT: Negative.    Eyes: Negative.   Respiratory: Negative.    Cardiovascular: Negative.   Gastrointestinal: Negative.   Genitourinary: Negative.   Musculoskeletal:        Right hand pain  Skin:        Increased pain, erythema, swelling right hand and up right forearm.  Neurological: Negative.   Endo/Heme/Allergies: Negative.   Psychiatric/Behavioral:  Positive for substance abuse.   All other systems reviewed and are negative.  Past Medical History:  Diagnosis Date   Fentanyl use disorder, severe (HCC)    Hepatitis C    Heroin abuse (HCC)    MRSA infection    Opioid overdose (HCC)    Overdose 07/08/2014    Past Surgical History:  Procedure  Laterality Date   APPENDECTOMY       reports that he has been smoking cigarettes. He has been smoking an average of .5 packs per day. He does not have any smokeless tobacco history on file. He reports current drug use. Drugs: IV, Cocaine, and Marijuana. He reports that he does not drink alcohol.  No Known Allergies  Family History  Family history unknown: Yes    Prior to Admission medications   Medication Sig Start Date End Date Taking? Authorizing Provider  acetaminophen (TYLENOL) 500 MG tablet Take 1,000 mg by mouth every 6 (six) hours as needed for mild pain.    [provider]  cyclobenzaprine (FLEXERIL) 10 MG tablet Take 1 tablet (10 mg total) by mouth 3 (three) times daily as needed for muscle spasms. 12/16/18   [name], PA-C  doxycycline (VIBRAMYCIN) 100 MG capsule Take 1 capsule (100 mg total) by mouth 2 (two) times daily. 01/03/15   [name], NP  ibuprofen (ADVIL) 600 MG tablet Take 1 tablet (600 mg total) by mouth every 6 (six) hours as needed for mild pain or moderate pain. 08/26/21   Fayrene Helper, PA-C  lidocaine (LIDODERM) 5 % Place 1 patch onto the skin daily. Remove & Discard patch within 12 hours or as directed by MD 04/27/19   [name], PA-C  methocarbamol (ROBAXIN) 500 MG tablet Take 1 tablet (500 mg total) by mouth every 8 (eight)  hours as needed for muscle spasms. 03/16/19   [name], MD  predniSONE (DELTASONE) 20 MG tablet Take 2 tablets (40 mg total) by mouth daily. 03/16/19   [name], MD    Physical Exam: Vitals:   11/17/21 1730 11/17/21 1815 11/17/21 1846 11/17/21 1920  BP: 140/89   139/87  Pulse: 88 95  91  Resp:    15  Temp:      SpO2: 95% 95% 96% 91%  Weight:      Height:        Physical Exam Vitals and nursing note reviewed.  Constitutional:      General: He is not in acute distress.    Appearance: He is not ill-appearing, toxic-appearing or diaphoretic.  HENT:     Head: Normocephalic and atraumatic.      Nose: Nose normal.  Cardiovascular:     Rate and Rhythm: Normal rate and regular rhythm.  Pulmonary:     Effort: Pulmonary effort is normal.  Abdominal:     General: Bowel sounds are normal. There is no distension.     Tenderness: There is no abdominal tenderness. There is no guarding.  Skin:    Capillary Refill: Capillary refill takes less than 2 seconds.     Findings: Erythema and lesion present.     Comments: See picture of right hand abscess  Neurological:     General: No focal deficit present.     Mental Status: He is alert and oriented to person, place, and time.      Labs on Admission: I have personally reviewed following labs and imaging studies  CBC: Recent Labs  Lab 11/17/21 1841  WBC 10.9*  HGB 13.9  HCT 42.6  MCV 97.9  PLT 231   Basic Metabolic Panel: Recent Labs  Lab 11/17/21 1841  NA 138  K 3.9  CL 106  CO2 23  GLUCOSE 105*  BUN 7  CREATININE 0.92  CALCIUM 8.6*   GFR: Estimated Creatinine Clearance: 127.9 mL/min (by C-G formula based on SCr of 0.92 mg/dL). Liver Function Tests: No results for input(s): AST, ALT, ALKPHOS, BILITOT, PROT, ALBUMIN in the last 168 hours. No results for input(s): LIPASE, AMYLASE in the last 168 hours. No results for input(s): AMMONIA in the last 168 hours. Coagulation Profile: Recent Labs  Lab 11/17/21 1841  INR 1.1   Cardiac Enzymes: No results for input(s): CKTOTAL, CKMB, CKMBINDEX, TROPONINI, TROPONINIHS in the last 168 hours. BNP (last 3 results) No results for input(s): PROBNP in the last 8760 hours. HbA1C: No results for input(s): HGBA1C in the last 72 hours. CBG: No results for input(s): GLUCAP in the last 168 hours. Lipid Profile: No results for input(s): CHOL, HDL, LDLCALC, TRIG, CHOLHDL, LDLDIRECT in the last 72 hours. Thyroid Function Tests: No results for input(s): TSH, T4TOTAL, FREET4, T3FREE, THYROIDAB in the last 72 hours. Anemia Panel: No results for input(s): VITAMINB12, FOLATE, FERRITIN,  TIBC, IRON, RETICCTPCT in the last 72 hours. Urine analysis:    Component Value Date/Time   COLORURINE YELLOW 09/12/2007 2153   APPEARANCEUR CLEAR 09/12/2007 2153   LABSPEC 1.045 (H) 09/12/2007 2153   PHURINE 7.0 09/12/2007 2153   GLUCOSEU NEGATIVE 09/12/2007 2153   HGBUR NEGATIVE 09/12/2007 2153   BILIRUBINUR NEGATIVE 09/12/2007 2153   KETONESUR NEGATIVE 09/12/2007 2153   PROTEINUR 30 (A) 09/12/2007 2153   UROBILINOGEN 1.0 09/12/2007 2153   NITRITE NEGATIVE 09/12/2007 2153   LEUKOCYTESUR NEGATIVE 09/12/2007 2153    Radiological Exams on Admission: I have personally reviewed  images DG Wrist Complete Right  Result Date: 11/17/2021 CLINICAL DATA:  IV drug abuse, erythema and swelling EXAM: RIGHT HAND - COMPLETE 3+ VIEW; RIGHT WRIST - COMPLETE 3+ VIEW COMPARISON:  None Available. FINDINGS: Right hand: Frontal, oblique, and lateral views are obtained. There is extensive soft tissue swelling greatest throughout the dorsum of the hand. No subcutaneous gas or radiopaque foreign body. There are no acute or destructive bony lesions. Joint spaces are well preserved. Right wrist: Frontal, oblique, lateral views are obtained. There is diffuse soft tissue swelling. No subcutaneous gas or radiopaque foreign body. No acute or destructive bony lesions. Joint spaces are well preserved. IMPRESSION: 1. Extensive soft tissue swelling of the hand and wrist. No subcutaneous gas or radiopaque foreign body. 2. No acute or destructive bony lesions within the right hand or wrist. Electronically Signed   By: Sharlet SalinaMichael  Brown M.D.   On: 11/17/2021 17:10   DG Hand Complete Right  Result Date: 11/17/2021 CLINICAL DATA:  IV drug abuse, erythema and swelling EXAM: RIGHT HAND - COMPLETE 3+ VIEW; RIGHT WRIST - COMPLETE 3+ VIEW COMPARISON:  None Available. FINDINGS: Right hand: Frontal, oblique, and lateral views are obtained. There is extensive soft tissue swelling greatest throughout the dorsum of the hand. No subcutaneous  gas or radiopaque foreign body. There are no acute or destructive bony lesions. Joint spaces are well preserved. Right wrist: Frontal, oblique, lateral views are obtained. There is diffuse soft tissue swelling. No subcutaneous gas or radiopaque foreign body. No acute or destructive bony lesions. Joint spaces are well preserved. IMPRESSION: 1. Extensive soft tissue swelling of the hand and wrist. No subcutaneous gas or radiopaque foreign body. 2. No acute or destructive bony lesions within the right hand or wrist. Electronically Signed   By: Sharlet SalinaMichael  Brown M.D.   On: 11/17/2021 17:10    EKG: My personal interpretation of EKG shows: NSR   Assessment/Plan Principal Problem:   Abscess of right hand Active Problems:   Opiate abuse, continuous (HCC)   Tobacco abuse   Assessment and Plan: * Abscess of right hand Observation medical bed. Abscess of right hand is a Acute illness/condition that poses a threat to life or bodily function. Prn IV 1 mg dilaudid until he has surgery(while he is NPO) then change to po oxycodone 5 mg. Blood cx obtained in ER. Continue with IV Vancomycin. Pharmacy to dose. Repeat CBC, CMP in AM.       Opiate abuse, continuous (HCC) Chronic. Pt abuses IV fentanyl on a regular basis. Has had opiate overdose in the past. At high risk for endocarditis. Discussed with pt that he needs to stop abusing IV narcotics. Check UDS.  Tobacco abuse Stable. Pt declined nicotine patch.   DVT prophylaxis: SQ Heparin Code Status: Full Code Family Communication: no family at bedside  Disposition Plan: return home  Consults called: EDP has consulted hand surgery Dr. Merlyn LotKuzma Admission status: Observation, Med-Surg   Carollee HerterEric Kalen Ratajczak, DO [facility] 11/17/2021, 7:48 PM

## 2021-11-17 NOTE — H&P (Signed)
Samuel Weber is an 36 y.o. male.   Chief Complaint: right hand infection HPI: 36 yo male states he has had worsening swelling, pain, erythema of right hand over past six days.  Has injected in this hand.  No fevers or chills.    Allergies: No Known Allergies  Past Medical History:  Diagnosis Date   Fentanyl use disorder, severe (HCC)    Hepatitis C    Heroin abuse (HCC)    MRSA infection    Opioid overdose (HCC)    Overdose 07/08/2014    Past Surgical History:  Procedure Laterality Date   APPENDECTOMY      Family History: Family History  Family history unknown: Yes    Social History:   reports that he has been smoking cigarettes. He has been smoking an average of .5 packs per day. He does not have any smokeless tobacco history on file. He reports current drug use. Drugs: IV, Cocaine, and Marijuana. He reports that he does not drink alcohol.  Medications: Medications Prior to Admission  Medication Sig Dispense Refill   acetaminophen (TYLENOL) 500 MG tablet Take 1,000 mg by mouth every 6 (six) hours as needed for mild pain.     ibuprofen (ADVIL) 600 MG tablet Take 1 tablet (600 mg total) by mouth every 6 (six) hours as needed for mild pain or moderate pain. 30 tablet 0    Results for orders placed or performed during the hospital encounter of 11/17/21 (from the past 48 hour(s))  CBC     Status: Abnormal   Collection Time: 11/17/21  6:41 PM  Result Value Ref Range   WBC 10.9 (H) 4.0 - 10.5 K/uL   RBC 4.35 4.22 - 5.81 MIL/uL   Hemoglobin 13.9 13.0 - 17.0 g/dL   HCT 29.9 24.2 - 68.3 %   MCV 97.9 80.0 - 100.0 fL   MCH 32.0 26.0 - 34.0 pg   MCHC 32.6 30.0 - 36.0 g/dL   RDW 41.9 62.2 - 29.7 %   Platelets 231 150 - 400 K/uL   nRBC 0.0 0.0 - 0.2 %    Comment: Performed at [facility], [address] [place], [place] [address]  Basic metabolic panel     Status: Abnormal   Collection Time: 11/17/21  6:41 PM  Result Value Ref Range   Sodium 138 135 - 145 mmol/L    Potassium 3.9 3.5 - 5.1 mmol/L   Chloride 106 98 - 111 mmol/L   CO2 23 22 - 32 mmol/L   Glucose, Bld 105 (H) 70 - 99 mg/dL    Comment: Glucose reference range applies only to samples taken after fasting for at least 8 hours.   BUN 7 6 - 20 mg/dL   Creatinine, Ser 1.94 0.61 - 1.24 mg/dL   Calcium 8.6 (L) 8.9 - 10.3 mg/dL   GFR, Estimated >17 >40 mL/min    Comment: (NOTE) Calculated using the CKD-EPI Creatinine Equation (2021)    Anion gap 9 5 - 15    Comment: Performed at [facility], [address], [place], [place] [address]  Lactic acid, plasma     Status: None   Collection Time: 11/17/21  6:41 PM  Result Value Ref Range   Lactic Acid, Venous 1.4 0.5 - 1.9 mmol/L    Comment: Performed at [facility], [address] [place], [place] [address]  Protime-INR     Status: None   Collection Time: 11/17/21  6:41 PM  Result  Value Ref Range   Prothrombin Time 13.7 11.4 - 15.2 seconds   INR 1.1 0.8 - 1.2    Comment: (NOTE) INR goal varies based on device and disease states. Performed at [facility], [address] [place], [place] [address]   APTT     Status: None   Collection Time: 11/17/21  6:41 PM  Result Value Ref Range   aPTT 34 24 - 36 seconds    Comment: Performed at [facility], [address], [place], [place] [address]    DG Wrist Complete Right  Result Date: 11/17/2021 CLINICAL DATA:  IV drug abuse, erythema and swelling EXAM: RIGHT HAND - COMPLETE 3+ VIEW; RIGHT WRIST - COMPLETE 3+ VIEW COMPARISON:  None Available. FINDINGS: Right hand: Frontal, oblique, and lateral views are obtained. There is extensive soft tissue swelling greatest throughout the dorsum of the hand. No subcutaneous gas or radiopaque foreign body. There are no acute or destructive bony lesions. Joint spaces are well preserved. Right wrist: Frontal, oblique, lateral views are obtained. There is diffuse soft tissue swelling. No subcutaneous gas or radiopaque foreign body. No acute  or destructive bony lesions. Joint spaces are well preserved. IMPRESSION: 1. Extensive soft tissue swelling of the hand and wrist. No subcutaneous gas or radiopaque foreign body. 2. No acute or destructive bony lesions within the right hand or wrist. Electronically Signed   By: Sharlet Salina M.D.   On: 11/17/2021 17:10   DG Hand Complete Right  Result Date: 11/17/2021 CLINICAL DATA:  IV drug abuse, erythema and swelling EXAM: RIGHT HAND - COMPLETE 3+ VIEW; RIGHT WRIST - COMPLETE 3+ VIEW COMPARISON:  None Available. FINDINGS: Right hand: Frontal, oblique, and lateral views are obtained. There is extensive soft tissue swelling greatest throughout the dorsum of the hand. No subcutaneous gas or radiopaque foreign body. There are no acute or destructive bony lesions. Joint spaces are well preserved. Right wrist: Frontal, oblique, lateral views are obtained. There is diffuse soft tissue swelling. No subcutaneous gas or radiopaque foreign body. No acute or destructive bony lesions. Joint spaces are well preserved. IMPRESSION: 1. Extensive soft tissue swelling of the hand and wrist. No subcutaneous gas or radiopaque foreign body. 2. No acute or destructive bony lesions within the right hand or wrist. Electronically Signed   By: Sharlet Salina M.D.   On: 11/17/2021 17:10      Blood pressure 131/89, pulse 94, temperature 98.6 F (37 C), resp. rate 12, height 6\' 1"  (1.854 m), weight 90.7 kg, SpO2 96 %.  General appearance: alert, cooperative, and appears stated age Head: Normocephalic, without obvious abnormality, atraumatic Neck: supple, symmetrical, trachea midline Extremities: Intact sensation and capillary refill all digits.  +epl/fpl/io.  No wounds. Right hand with swelling and erythema dorsoradial aspect.  Tender to palpation.  Finger motion limited by swelling but not very painful.  Non tender at wrist and able to move wrist with minimal pain Pulses: 2+ and symmetric Skin: Skin color, texture, turgor  normal. No rashes or lesions Neurologic: Grossly normal Incision/Wound: none  Assessment/Plan Right hand abscess.  Recommend OR for incision and drainage.  Risks, benefits and alternatives of surgery were discussed including risks of blood loss, infection, damage to nerves/vessels/tendons/ligament/bone, failure of surgery, need for additional surgery, complication with wound healing, stiffness, need for repeat irrigation and debridement.  He voiced understanding of these risks and elected to proceed.    11/17/2021, 8:58 PM

## 2021-11-17 NOTE — Assessment & Plan Note (Addendum)
Observation medical bed. Abscess of right hand is a Acute illness/condition that poses a threat to life or bodily function. Prn IV 1 mg dilaudid until he has surgery(while he is NPO) then change to po oxycodone 5 mg. Blood cx obtained in ER. Continue with IV Vancomycin. Pharmacy to dose. Repeat CBC, CMP in AM.

## 2021-11-17 NOTE — ED Provider Triage Note (Signed)
Emergency Medicine Provider Triage Evaluation Note  Samuel Weber , a 36 y.o. male  was evaluated in triage.  Pt complains of abscess on right hand that formed in the last week.  He admits history of IV drug use, reports last used on Monday.  He reports he uses fentanyl.  He reports that he does feel some fever, chills, difficulty with movement of the right wrist.  Reports significant pain, fatigue.  He does have a history of MRSA infection.  Review of Systems  Positive: Hand abscess, fever, chills, fatigue Negative: Chest pain ,shob  Physical Exam  BP (!) 153/91   Pulse 91   Temp 98.6 F (37 C)   Resp (!) 22   Ht 6\' 1"  (1.854 m)   Wt 90.7 kg   SpO2 99%   BMI 26.38 kg/m  Gen:   Awake, ill appearing Resp:  Normal effort  MSK:   Moves extremities without difficulty  Other:  significant redness, fluctuance on the dorsal aspect of the right hand with redness tracking up the right wrist, decreased range of motion of right wrist secondary to pain, as well as some swelling.  Radial, ulnar pulses intact at this time.  Medical Decision Making  Medically screening exam initiated at 4:46 PM.  Appropriate orders placed.  was informed that the remainder of the evaluation will be completed by another provider, this initial triage assessment does not replace that evaluation, and the importance of remaining in the ED until their evaluation is complete.  Workup initiated   Alessandra Bevels, Olene Floss 11/17/21 1648

## 2021-11-17 NOTE — ED Notes (Signed)
This RN assists in trying to obtain an IV x3 without success. IV team consult placed at this time

## 2021-11-17 NOTE — Assessment & Plan Note (Signed)
Stable. Pt declined nicotine patch.

## 2021-11-17 NOTE — Progress Notes (Addendum)
[facility] RN Report to floor given  Gave report to  [facility] RN. Room: 5N18   Discussed surgery, meds given in OR and Pacu, VS, IV fluids given, EBL, urine output, pain and other pertinent information. Also discussed if pt had any family or friends here or belongings with them.   Discussed hx of subs abuse, gf in waiting area, possibly under the influence per pre op, she is currently just sleeping, stated she worked all day.  Unsure if she is or not. Discussed this with RN on floor who will be getting him, told her I would help her make up bed and best bet would be to just let her lay down. She has not been any trouble so far and has been appropriate. She does not have a ride home. Will bring her up later after pt is settled.   After pt settled and bringing up pt, a RN came in to say pt's gf could not stay, it was not appropriate. I asked why? He stated she was under the influence. We are unsure if she is. He told me I would need to take her downstairs. I told him she did not have a car or ride home and he would need to call [facility] to see how to proceed.   Pt exits my care.

## 2021-11-17 NOTE — Progress Notes (Signed)
Postop note: Status post incision and drainage right hand.  Gross purulence and dorsum of hand.  Wound debrided and packed with iodoform gauze.  Start hydrotherapy in 2 to 4 days.  Okay for discharge from hand standpoint when white blood count normalizing, afebrile, pain controlled.  Can follow-up in the office for continued hydrotherapy.

## 2021-11-17 NOTE — Subjective & Objective (Signed)
CC: right hand swelling HPI: 36 year old white male history of chronic narcotic abuse, IV drug use presents to the ER today with swelling of his right hand since Saturday, May 20.  Patient abuses IV narcotics.  His drug of choice is fentanyl.  He states that he thinks he may have missed the vein and injected outside of his vein.  He has had increasing swelling and redness of the hand.  Increasing pain.  Presented to the ER today.  Vitals temp 98.6 heart rate 91 blood pressure 153/91 satting 99% on room air.  Labs are reassuring.  White count 10.9.  Blood cultures were obtained.  EDP discussed case with hand surgery.  They are planning taken the patient to the operating room tonight for a washout.  Patient started on IV vancomycin.  [facility] hospitalist contacted for admission.

## 2021-11-17 NOTE — Transfer of Care (Signed)
Immediate Anesthesia Transfer of Care Note  Patient: Samuel Weber  Procedure(s) Performed: RIGHT HAND IRRIGATION AND DEBRIDEMENT (Right: Hand)  Patient Location: PACU  Anesthesia Type:General  Level of Consciousness: drowsy  Airway & Oxygen Therapy: Patient Spontanous Breathing and Patient connected to face mask oxygen  Post-op Assessment: Report given to RN and Post -op Vital signs reviewed and stable  Post vital signs: Reviewed and stable  Last Vitals:  Vitals Value Taken Time  BP 130/80 11/17/21 2216  Temp    Pulse 92 11/17/21 2216  Resp 14 11/17/21 2216  SpO2 96 % 11/17/21 2216  Vitals shown include unvalidated device data.  Last Pain:  Vitals:   11/17/21 1629  PainSc: 10-Worst pain ever         Complications: No notable events documented.

## 2021-11-17 NOTE — ED Provider Notes (Signed)
[facility]    CSN: [id number] Arrival date & time: 11/17/21  1447      History   Chief Complaint Chief Complaint  Patient presents with   Abscess    HPI Samuel Weber is a 36 y.o. male.   Patient presents with right hand pain, swelling and erythema for 6 days after recreational injection of needle into hand.  Endorses IV drug use, endorsing last usage 6 days ago.  Limited range of motion.  Endorses that he is taking for antibiotic pills from a friend, unsure which kind.  Denies numbness or tingling.  History of heroin abuse and opioid usage.   Past Medical History:  Diagnosis Date   Heroin abuse (HCC)    MRSA infection     Patient Active Problem List   Diagnosis Date Noted   Overdose 07/08/2014   Coma (HCC) 07/08/2014   Acute respiratory failure (HCC) 07/08/2014   Opioid overdose (HCC)    Opioid dependence with withdrawal (HCC) 03/01/2014    Past Surgical History:  Procedure Laterality Date   APPENDECTOMY         Home Medications    Prior to Admission medications   Medication Sig Start Date End Date Taking? Authorizing Provider  acetaminophen (TYLENOL) 500 MG tablet Take 1,000 mg by mouth every 6 (six) hours as needed for mild pain.    [provider]  cyclobenzaprine (FLEXERIL) 10 MG tablet Take 1 tablet (10 mg total) by mouth 3 (three) times daily as needed for muscle spasms. 12/16/18   [name], PA-C  doxycycline (VIBRAMYCIN) 100 MG capsule Take 1 capsule (100 mg total) by mouth 2 (two) times daily. 01/03/15   [name], NP  ibuprofen (ADVIL) 600 MG tablet Take 1 tablet (600 mg total) by mouth every 6 (six) hours as needed for mild pain or moderate pain. 08/26/21   Fayrene Helper, PA-C  lidocaine (LIDODERM) 5 % Place 1 patch onto the skin daily. Remove & Discard patch within 12 hours or as directed by MD 04/27/19   [name], PA-C  methocarbamol (ROBAXIN) 500 MG tablet Take 1 tablet (500 mg total) by mouth every 8 (eight)  hours as needed for muscle spasms. 03/16/19   [name], MD  predniSONE (DELTASONE) 20 MG tablet Take 2 tablets (40 mg total) by mouth daily. 03/16/19   [name], MD    Family History Family History  Family history unknown: Yes    Social History Social History   Tobacco Use   Smoking status: Every Day    Packs/day: 0.50    Types: Cigarettes  Substance Use Topics   Alcohol use: No   Drug use: Yes    Types: IV, Cocaine, Marijuana    Comment: heroin     Allergies   Patient has no known allergies.   Review of Systems Review of Systems  Constitutional: Negative.   Respiratory: Negative.    Cardiovascular: Negative.   Skin:  Positive for wound. Negative for color change, pallor and rash.    Physical Exam Triage Vital Signs ED Triage Vitals  Enc Vitals Group     BP 11/17/21 1530 (!) 141/94     Pulse Rate 11/17/21 1530 90     Resp 11/17/21 1530 20     Temp --      Temp src --      SpO2 11/17/21 1530 96 %     Weight --      Height --  Head Circumference --      Peak Flow --      Pain Score 11/17/21 1534 9     Pain Loc --      Pain Edu? --      Excl. in GC? --    No data found.  Updated Vital Signs BP (!) 141/94 (BP Location: Right Arm)   Pulse 90   Resp 20   SpO2 96%   Visual Acuity Right Eye Distance:   Left Eye Distance:   Bilateral Distance:    Right Eye Near:   Left Eye Near:    Bilateral Near:     Physical Exam   UC Treatments / Results  Labs (all labs ordered are listed, but only abnormal results are displayed) Labs Reviewed - No data to display  EKG   Radiology No results found.  Procedures Procedures (including critical care time)  Medications Ordered in UC Medications - No data to display  Initial Impression / Assessment and Plan / UC Course  I have reviewed the triage vital signs and the nursing notes.  Pertinent labs & imaging results that were available during my care of the patient were reviewed by  me and considered in my medical decision making (see chart for details).  Abscess of the right hand  Patient sent to the nearest emergency department for intravenous antibiotics, hand is erythematous, moderate to severe swelling and tender to touch, swelling tenderness extending into the forearm with +1 edema, skin is hot to touch, currently appears to be visibly intoxicated with unknown substance, denies any drug or alcohol use today, vital signs are stable and patient will be escorted by wife whom  per nursing staff also appears to be visibly intoxicated in the lobby Final Clinical Impressions(s) / UC Diagnoses   Final diagnoses:  None   Discharge Instructions   None    ED Prescriptions   None    PDMP not reviewed this encounter.   Valinda Hoar, [place] 11/17/21 [phone]

## 2021-11-17 NOTE — ED Notes (Signed)
Per Legrand Como RN a 18g peripheral IV was placed at this time.

## 2021-11-17 NOTE — Progress Notes (Signed)
Pharmacy Antibiotic Note  Samuel Weber is a 36 y.o. male admitted on 11/17/2021 presenting with swelling and redness of hand, self injected fentanyl at site, hx of IVDU.  Pharmacy has been consulted for vancomycin and cefepime dosing.  Plan: Vancomycin 2000 mg IV x 1, then 1750 mg IV q 12h (eAUC 484, SCr 0.92) Cefepime 2g IV every 8 hours Monitor renal function, Cx and clinical progression to narrow Vancomycin levels at steady state  Height: 6\' 1"  (185.4 cm) Weight: 90.7 kg (199 lb 15.3 oz) IBW/kg (Calculated) : 79.9  Temp (24hrs), Avg:98.6 F (37 C), Min:98.6 F (37 C), Max:98.6 F (37 C)  Recent Labs  Lab 11/17/21 1841  WBC 10.9*  CREATININE 0.92  LATICACIDVEN 1.4    Estimated Creatinine Clearance: 127.9 mL/min (by C-G formula based on SCr of 0.92 mg/dL).    No Known Allergies  Bertis Ruddy, PharmD Clinical Pharmacist ED Pharmacist Phone # [phone] 11/17/2021 7:58 PM

## 2021-11-17 NOTE — ED Triage Notes (Signed)
Pt presents with abscess on right hand X 1 week; area visible swollen and red.

## 2021-11-17 NOTE — Assessment & Plan Note (Addendum)
Chronic. Pt abuses IV fentanyl on a regular basis. Has had opiate overdose in the past. At high risk for endocarditis. Discussed with pt that he needs to stop abusing IV narcotics. Check UDS.

## 2021-11-17 NOTE — Anesthesia Preprocedure Evaluation (Addendum)
Anesthesia Evaluation  Patient identified by MRN, date of birth, ID band Patient awake    Reviewed: Allergy & Precautions, NPO status , Patient's Chart, lab work & pertinent test results  Airway Mallampati: II  TM Distance: >3 FB Neck ROM: Full    Dental  (+) Dental Advisory Given, Poor Dentition, Missing   Pulmonary Current Smoker and Patient abstained from smoking.,    Pulmonary exam normal breath sounds clear to auscultation       Cardiovascular negative cardio ROS Normal cardiovascular exam Rhythm:Regular Rate:Normal     Neuro/Psych negative neurological ROS  negative psych ROS   GI/Hepatic negative GI ROS, (+)     substance abuse  cocaine use and IV drug use, Hepatitis -, C  Endo/Other  negative endocrine ROS  Renal/GU negative Renal ROS     Musculoskeletal  (+) narcotic dependentRight hand infection   Abdominal   Peds  Hematology negative hematology ROS (+)   Anesthesia Other Findings Day of surgery medications reviewed with the patient.  Reproductive/Obstetrics                            Anesthesia Physical Anesthesia Plan  ASA: 3 and emergent  Anesthesia Plan: General   Post-op Pain Management: Ofirmev IV (intra-op)*   Induction: Intravenous, Rapid sequence and Cricoid pressure planned  PONV Risk Score and Plan: 1 and Dexamethasone and Ondansetron  Airway Management Planned: Oral ETT  Additional Equipment:   Intra-op Plan:   Post-operative Plan: Extubation in OR  Informed Consent: I have reviewed the patients History and Physical, chart, labs and discussed the procedure including the risks, benefits and alternatives for the proposed anesthesia with the patient or authorized representative who has indicated his/her understanding and acceptance.     Dental advisory given  Plan Discussed with: CRNA  Anesthesia Plan Comments:        Anesthesia Quick  Evaluation

## 2021-11-17 NOTE — ED Triage Notes (Signed)
The pt is an iv drug user  he uses fentanyl  last usage was Saturday  his rt hand is red and swollen with pain

## 2021-11-17 NOTE — Anesthesia Procedure Notes (Signed)
Procedure Name: Intubation Date/Time: 11/17/2021 9:05 PM Performed by: Reece Agar, CRNA Pre-anesthesia Checklist: Patient identified, Emergency Drugs available, Suction available and Patient being monitored Patient Re-evaluated:Patient Re-evaluated prior to induction Oxygen Delivery Method: Circle System Utilized Preoxygenation: Pre-oxygenation with 100% oxygen Induction Type: IV induction, Rapid sequence and Cricoid Pressure applied Laryngoscope Size: Mac and 4 Grade View: Grade I Tube type: Oral Number of attempts: 1 Airway Equipment and Method: Stylet Placement Confirmation: ETT inserted through vocal cords under direct vision, positive ETCO2 and breath sounds checked- equal and bilateral Secured at: 23 cm Tube secured with: Tape Dental Injury: Teeth and Oropharynx as per pre-operative assessment

## 2021-11-17 NOTE — Discharge Instructions (Signed)
Needs evaluation for IV antibiotics

## 2021-11-17 NOTE — ED Notes (Signed)
Unable to get blood, hard stick

## 2021-11-17 NOTE — ED Notes (Signed)
Patient is being discharged from the Urgent Care and sent to the Emergency Department via personal vehicle  . Per Provider Salli Quarry, patient is in need of higher level of care due to needing IV antibiotics for abscess infection. Patient is aware and verbalizes understanding of plan of care.   Vitals:   11/17/21 1530  BP: (!) 141/94  Pulse: 90  Resp: 20  SpO2: 96%

## 2021-11-17 NOTE — Op Note (Signed)
NAME: Samuel Weber MEDICAL RECORD NO: [id number] DATE OF BIRTH: [birth year] FACILITY: [facility] LOCATION: [facility] OR PHYSICIAN: Tami Ribas, MD   OPERATIVE REPORT   DATE OF PROCEDURE: 11/17/21    PREOPERATIVE DIAGNOSIS: Right hand abscess   POSTOPERATIVE DIAGNOSIS: Right hand abscess   PROCEDURE: Incision and drainage right hand abscess   SURGEON:  Betha Loa, M.D.   ASSISTANT: none   ANESTHESIA:  General   INTRAVENOUS FLUIDS:  Per anesthesia flow sheet.   ESTIMATED BLOOD LOSS:  Minimal.   COMPLICATIONS:  None.   SPECIMENS: Cultures to micro   TOURNIQUET TIME:    Total Tourniquet Time Documented: Upper Arm (Right) - 19 minutes Total: Upper Arm (Right) - 19 minutes    DISPOSITION:  Stable to PACU.   INDICATIONS: 36 year old male states he has had worsening swelling erythema and pain at the dorsum of the right hand over the past 6 days.  He is injected in this area approximately week ago.  No fevers or chills.  He presented to the emergency department today.  Recommended incision and drainage of the hand in the operating room.  Risks, benefits and alternatives of surgery were discussed including the risks of blood loss, infection, damage to nerves, vessels, tendons, ligaments, bone for surgery, need for additional surgery, complications with wound healing, continued pain, stiffness, , need for repeat irrigation and debridement.  He voiced understanding of these risks and elected to proceed.  OPERATIVE COURSE:  After being identified preoperatively by myself,  the patient and I agreed on the procedure and site of the procedure.  The surgical site was marked.  Surgical consent had been signed.  He had been given IV antibiotics in the emergency department.  He was transferred to the operating room and placed on the operating table in supine position with the Right upper extremity on an arm board.  General anesthesia was induced by the anesthesiologist.  Right upper extremity was  prepped and draped in normal sterile orthopedic fashion.  A surgical pause was performed between the surgeons, anesthesia, and operating room staff and all were in agreement as to the patient, procedure, and site of procedure.  Tourniquet at the proximal aspect of the extremity was inflated to 250 mmHg after exsanguination of the arm with an Esmarch bandage.  Incision was made on the dorsum of the hand over the fluctuant area between the index and long finger metacarpals.  This was carried into subcutaneous tissues by spreading technique.  There was expression of gross purulence.  Cultures were taken for aerobes and anaerobes.  The abscess cavity was well-defined.  It was cleared with a Ray-Tec sponge.  Bipolar electrocautery was used to obtain hemostasis.  The area underneath the extensor tendon to the index finger was visualized.  There was no purulence underneath the tendon.  The wound was debrided of any devitalized tissues using a Ray-Tec sponge and pickups and scissors.  Any devitalized tissue was removed.  The wound was copiously irrigated with sterile saline by cystoscopy tubing.  It was then packed with half-inch iodoform gauze.  The wound was injected with quarter percent plain Marcaine to aid in postoperative analgesia.  It was dressed with sterile 4 x 4's and ABD and wrapped with a Kerlix bandage.  Volar splint was placed and wrapped with Kerlix and Ace bandage.  The tourniquet was deflated at 19 minutes.  Fingertips were pink with brisk capillary refill after deflation of tourniquet.  The operative  drapes were broken down.  The  patient was awoken from anesthesia safely.  He was transferred back to the stretcher and taken to PACU in stable condition.  He is admitted to the hospitalist for IV antibiotics.  We will start hydrotherapy in 2 to 4 days.  Betha Loa, MD Electronically signed, 11/17/21

## 2021-11-17 NOTE — Discharge Instructions (Signed)

## 2021-11-18 ENCOUNTER — Other Ambulatory Visit: Payer: Self-pay

## 2021-11-18 ENCOUNTER — Encounter (HOSPITAL_COMMUNITY): Payer: Self-pay | Admitting: Orthopedic Surgery

## 2021-11-18 ENCOUNTER — Emergency Department (HOSPITAL_COMMUNITY)
Admission: EM | Admit: 2021-11-18 | Discharge: 2021-11-18 | Disposition: A | Discharge status: Home / Self Care | Payer: 59 | Attending: Emergency Medicine | Admitting: Emergency Medicine

## 2021-11-18 DIAGNOSIS — M7989 Other specified soft tissue disorders: Secondary | ICD-10-CM | POA: Diagnosis not present

## 2021-11-18 DIAGNOSIS — L02511 Cutaneous abscess of right hand: Secondary | ICD-10-CM

## 2021-11-18 MED ORDER — DOXYCYCLINE HYCLATE 100 MG PO TABS
100.0000 mg | ORAL_TABLET | Freq: Once | ORAL | Status: AC
Start: 2021-11-18 — End: 2021-11-18
  Administered 2021-11-18: 100 mg via ORAL
  Filled 2021-11-18: qty 1

## 2021-11-18 MED ORDER — DOXYCYCLINE HYCLATE 100 MG PO CAPS: 100.0000 mg | ORAL_CAPSULE | Freq: Two times a day (BID) | ORAL | 0 refills | Status: DC

## 2021-11-18 NOTE — Discharge Instructions (Signed)
Please read and follow all provided instructions.  Your diagnoses today include:  1. Abscess of right hand     Tests performed today include: Vital signs. See below for your results today.   Medications prescribed:  Doxycycline - antibiotic  You have been prescribed an antibiotic medicine: take the entire course of medicine even if you are feeling better. Stopping early can cause the antibiotic not to work.  Take any prescribed medications only as directed.   Home care instructions:  Follow any educational materials contained in this packet  Follow-up instructions: Call Dr. Merrilee Seashore office on Tuesday (Monday is Memorial Day) to make sure you have a follow-up appointment scheduled.   Return instructions:  Return to the Emergency Department if you have: Fever Worsening symptoms Worsening pain Worsening swelling Redness of the skin that moves away from the affected area, especially if it streaks away from the affected area  Any other emergent concerns  Your vital signs today were: BP 140/84   Pulse 98   Temp 98.4 F (36.9 C) (Oral)   Resp 18   Ht 6\' 1"  (1.854 m)   Wt 81.6 kg   SpO2 97%   BMI 23.75 kg/m  If your blood pressure (BP) was elevated above 135/85 this visit, please have this repeated by your doctor within one month. --------------

## 2021-11-18 NOTE — Discharge Summary (Signed)
Pt left AMA °

## 2021-11-18 NOTE — Anesthesia Postprocedure Evaluation (Signed)
Anesthesia Post Note  Patient: GAREK SCHUNEMAN  Procedure(s) Performed: RIGHT HAND IRRIGATION AND DEBRIDEMENT (Right: Hand)     Patient location during evaluation: PACU Anesthesia Type: General Level of consciousness: awake and alert Pain management: pain level controlled Vital Signs Assessment: post-procedure vital signs reviewed and stable Respiratory status: spontaneous breathing, nonlabored ventilation, respiratory function stable and patient connected to nasal cannula oxygen Cardiovascular status: blood pressure returned to baseline, stable and tachycardic Postop Assessment: no apparent nausea or vomiting Anesthetic complications: no   No notable events documented.  Last Vitals:  Vitals:   11/17/21 2240 11/17/21 2301  BP:  118/73  Pulse: 89 (!) 102  Resp: 18   Temp:    SpO2: 92% 92%    Last Pain:  Vitals:   11/17/21 2225  PainSc: 0-No pain                 Collene Schlichter
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
## 2021-11-21 LAB — CULTURE, BLOOD (ROUTINE X 2): Special Requests: ADEQUATE

## 2021-11-22 DIAGNOSIS — Z4889 Encounter for other specified surgical aftercare: Secondary | ICD-10-CM | POA: Diagnosis not present

## 2021-11-22 DIAGNOSIS — R600 Localized edema: Secondary | ICD-10-CM | POA: Diagnosis not present

## 2021-11-22 DIAGNOSIS — M25641 Stiffness of right hand, not elsewhere classified: Secondary | ICD-10-CM | POA: Diagnosis not present

## 2021-11-22 DIAGNOSIS — L02511 Cutaneous abscess of right hand: Secondary | ICD-10-CM | POA: Diagnosis not present

## 2021-11-22 DIAGNOSIS — T148XXA Other injury of unspecified body region, initial encounter: Secondary | ICD-10-CM | POA: Diagnosis not present

## 2021-11-22 LAB — AEROBIC/ANAEROBIC CULTURE W GRAM STAIN (SURGICAL/DEEP WOUND)

## 2021-11-22 LAB — CULTURE, BLOOD (ROUTINE X 2): Culture: NO GROWTH

## 2021-11-24 DIAGNOSIS — Z4889 Encounter for other specified surgical aftercare: Secondary | ICD-10-CM | POA: Diagnosis not present

## 2021-11-24 DIAGNOSIS — M25641 Stiffness of right hand, not elsewhere classified: Secondary | ICD-10-CM | POA: Diagnosis not present

## 2021-11-24 DIAGNOSIS — R600 Localized edema: Secondary | ICD-10-CM | POA: Diagnosis not present

## 2021-11-24 DIAGNOSIS — L02511 Cutaneous abscess of right hand: Secondary | ICD-10-CM | POA: Diagnosis not present

## 2021-11-24 DIAGNOSIS — T148XXA Other injury of unspecified body region, initial encounter: Secondary | ICD-10-CM | POA: Diagnosis not present

## 2021-11-27 DIAGNOSIS — T148XXA Other injury of unspecified body region, initial encounter: Secondary | ICD-10-CM | POA: Diagnosis not present

## 2021-11-27 DIAGNOSIS — L02511 Cutaneous abscess of right hand: Secondary | ICD-10-CM | POA: Diagnosis not present

## 2021-11-27 DIAGNOSIS — R600 Localized edema: Secondary | ICD-10-CM | POA: Diagnosis not present

## 2021-11-27 DIAGNOSIS — Z4889 Encounter for other specified surgical aftercare: Secondary | ICD-10-CM | POA: Diagnosis not present

## 2021-11-27 DIAGNOSIS — M25641 Stiffness of right hand, not elsewhere classified: Secondary | ICD-10-CM | POA: Diagnosis not present

## 2021-11-30 DIAGNOSIS — T148XXA Other injury of unspecified body region, initial encounter: Secondary | ICD-10-CM | POA: Diagnosis not present

## 2021-11-30 DIAGNOSIS — L02511 Cutaneous abscess of right hand: Secondary | ICD-10-CM | POA: Diagnosis not present

## 2021-11-30 DIAGNOSIS — Z4889 Encounter for other specified surgical aftercare: Secondary | ICD-10-CM | POA: Diagnosis not present

## 2021-11-30 DIAGNOSIS — R600 Localized edema: Secondary | ICD-10-CM | POA: Diagnosis not present

## 2021-11-30 DIAGNOSIS — M25641 Stiffness of right hand, not elsewhere classified: Secondary | ICD-10-CM | POA: Diagnosis not present

## 2021-12-04 DIAGNOSIS — T148XXA Other injury of unspecified body region, initial encounter: Secondary | ICD-10-CM | POA: Diagnosis not present

## 2021-12-04 DIAGNOSIS — L02511 Cutaneous abscess of right hand: Secondary | ICD-10-CM | POA: Diagnosis not present

## 2021-12-04 DIAGNOSIS — R600 Localized edema: Secondary | ICD-10-CM | POA: Diagnosis not present

## 2021-12-04 DIAGNOSIS — Z4889 Encounter for other specified surgical aftercare: Secondary | ICD-10-CM | POA: Diagnosis not present

## 2021-12-04 DIAGNOSIS — M25641 Stiffness of right hand, not elsewhere classified: Secondary | ICD-10-CM | POA: Diagnosis not present

## 2021-12-08 DIAGNOSIS — M25641 Stiffness of right hand, not elsewhere classified: Secondary | ICD-10-CM | POA: Diagnosis not present

## 2021-12-08 DIAGNOSIS — T148XXA Other injury of unspecified body region, initial encounter: Secondary | ICD-10-CM | POA: Diagnosis not present

## 2021-12-08 DIAGNOSIS — Z4889 Encounter for other specified surgical aftercare: Secondary | ICD-10-CM | POA: Diagnosis not present

## 2021-12-08 DIAGNOSIS — L02511 Cutaneous abscess of right hand: Secondary | ICD-10-CM | POA: Diagnosis not present

## 2021-12-12 DIAGNOSIS — Z4889 Encounter for other specified surgical aftercare: Secondary | ICD-10-CM | POA: Diagnosis not present

## 2021-12-12 DIAGNOSIS — L02511 Cutaneous abscess of right hand: Secondary | ICD-10-CM | POA: Diagnosis not present

## 2021-12-12 DIAGNOSIS — R600 Localized edema: Secondary | ICD-10-CM | POA: Diagnosis not present

## 2021-12-12 DIAGNOSIS — T148XXA Other injury of unspecified body region, initial encounter: Secondary | ICD-10-CM | POA: Diagnosis not present

## 2021-12-12 DIAGNOSIS — M25641 Stiffness of right hand, not elsewhere classified: Secondary | ICD-10-CM | POA: Diagnosis not present

## 2021-12-14 DIAGNOSIS — M25641 Stiffness of right hand, not elsewhere classified: Secondary | ICD-10-CM | POA: Diagnosis not present

## 2021-12-14 DIAGNOSIS — L02511 Cutaneous abscess of right hand: Secondary | ICD-10-CM | POA: Diagnosis not present

## 2021-12-14 DIAGNOSIS — T148XXA Other injury of unspecified body region, initial encounter: Secondary | ICD-10-CM | POA: Diagnosis not present

## 2021-12-14 DIAGNOSIS — Z4889 Encounter for other specified surgical aftercare: Secondary | ICD-10-CM | POA: Diagnosis not present

## 2021-12-19 DIAGNOSIS — L02511 Cutaneous abscess of right hand: Secondary | ICD-10-CM | POA: Diagnosis not present

## 2021-12-19 DIAGNOSIS — T148XXA Other injury of unspecified body region, initial encounter: Secondary | ICD-10-CM | POA: Diagnosis not present

## 2021-12-19 DIAGNOSIS — R600 Localized edema: Secondary | ICD-10-CM | POA: Diagnosis not present

## 2021-12-19 DIAGNOSIS — M25641 Stiffness of right hand, not elsewhere classified: Secondary | ICD-10-CM | POA: Diagnosis not present

## 2021-12-19 DIAGNOSIS — Z4889 Encounter for other specified surgical aftercare: Secondary | ICD-10-CM | POA: Diagnosis not present

## 2022-09-03 ENCOUNTER — Other Ambulatory Visit: Payer: Self-pay

## 2022-09-03 ENCOUNTER — Encounter: Payer: Self-pay | Admitting: *Deleted

## 2022-09-03 DIAGNOSIS — Y92410 Unspecified street and highway as the place of occurrence of the external cause: Secondary | ICD-10-CM | POA: Diagnosis not present

## 2022-09-03 DIAGNOSIS — M549 Dorsalgia, unspecified: Secondary | ICD-10-CM | POA: Insufficient documentation

## 2022-09-03 DIAGNOSIS — Z5321 Procedure and treatment not carried out due to patient leaving prior to being seen by health care provider: Secondary | ICD-10-CM | POA: Diagnosis not present
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
## 2022-09-04 ENCOUNTER — Emergency Department: Payer: 59

## 2022-09-04 ENCOUNTER — Encounter: Payer: Self-pay | Admitting: Emergency Medicine

## 2022-09-04 ENCOUNTER — Emergency Department
Admission: EM | Admit: 2022-09-04 | Discharge: 2022-09-04 | Disposition: A | Discharge status: Home / Self Care | Payer: 59 | Attending: Emergency Medicine | Admitting: Emergency Medicine

## 2022-09-04 ENCOUNTER — Other Ambulatory Visit: Payer: Self-pay

## 2022-09-04 ENCOUNTER — Emergency Department
Admission: EM | Admit: 2022-09-04 | Discharge: 2022-09-04 | Discharge status: Against Medical Advice | Payer: 59 | Attending: Emergency Medicine | Admitting: Emergency Medicine

## 2022-09-04 DIAGNOSIS — S39012A Strain of muscle, fascia and tendon of lower back, initial encounter: Secondary | ICD-10-CM

## 2022-09-04 DIAGNOSIS — S40012A Contusion of left shoulder, initial encounter: Secondary | ICD-10-CM

## 2022-09-04 DIAGNOSIS — L03011 Cellulitis of right finger: Secondary | ICD-10-CM

## 2022-09-04 DIAGNOSIS — Y92410 Unspecified street and highway as the place of occurrence of the external cause: Secondary | ICD-10-CM | POA: Insufficient documentation

## 2022-09-04 DIAGNOSIS — M545 Low back pain, unspecified: Secondary | ICD-10-CM | POA: Diagnosis not present

## 2022-09-04 DIAGNOSIS — M25512 Pain in left shoulder: Secondary | ICD-10-CM | POA: Diagnosis not present

## 2022-09-04 MED ORDER — MELOXICAM 15 MG PO TABS: 15.0000 mg | ORAL_TABLET | Freq: Every day | ORAL | 0 refills | Status: AC

## 2022-09-04 MED ORDER — CEPHALEXIN 500 MG PO CAPS: 1000.0000 mg | ORAL_CAPSULE | Freq: Two times a day (BID) | ORAL | 0 refills | Status: DC

## 2022-09-04 MED ORDER — METHOCARBAMOL 500 MG PO TABS: 500.0000 mg | ORAL_TABLET | Freq: Four times a day (QID) | ORAL | 0 refills | Status: DC
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
## 2022-09-04 NOTE — ED Notes (Signed)
No answer when called several times from lobby 
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
## 2022-10-28 ENCOUNTER — Other Ambulatory Visit: Payer: Self-pay

## 2022-10-28 DIAGNOSIS — L03011 Cellulitis of right finger: Secondary | ICD-10-CM | POA: Diagnosis not present

## 2022-10-28 DIAGNOSIS — F17200 Nicotine dependence, unspecified, uncomplicated: Secondary | ICD-10-CM | POA: Insufficient documentation

## 2022-10-28 DIAGNOSIS — M7989 Other specified soft tissue disorders: Secondary | ICD-10-CM | POA: Diagnosis not present

## 2022-10-28 NOTE — ED Triage Notes (Addendum)
Pt presents with swelling and redness to the R hand for three days. Pt concerned for infection and is requesting abx. Pt denies fever, N/V.

## 2022-10-29 ENCOUNTER — Emergency Department
Admission: EM | Admit: 2022-10-29 | Discharge: 2022-10-29 | Disposition: A | Discharge status: Home / Self Care | Payer: 59 | Attending: Emergency Medicine | Admitting: Emergency Medicine

## 2022-10-29 DIAGNOSIS — L03011 Cellulitis of right finger: Secondary | ICD-10-CM

## 2022-10-29 LAB — CBC WITH DIFFERENTIAL/PLATELET
Abs Immature Granulocytes: 0.02 10*3/uL (ref 0.00–0.07)
Basophils Absolute: 0 10*3/uL (ref 0.0–0.1)
Basophils Relative: 1 %
Eosinophils Absolute: 0.1 10*3/uL (ref 0.0–0.5)
Eosinophils Relative: 2 %
HCT: 53.2 % — ABNORMAL HIGH (ref 39.0–52.0)
Hemoglobin: 17.5 g/dL — ABNORMAL HIGH (ref 13.0–17.0)
Immature Granulocytes: 0 %
Lymphocytes Relative: 48 %
Lymphs Abs: 3.6 10*3/uL (ref 0.7–4.0)
MCH: 32.4 pg (ref 26.0–34.0)
MCHC: 32.9 g/dL (ref 30.0–36.0)
MCV: 98.5 fL (ref 80.0–100.0)
Monocytes Absolute: 0.8 10*3/uL (ref 0.1–1.0)
Monocytes Relative: 10 %
Neutro Abs: 2.9 10*3/uL (ref 1.7–7.7)
Neutrophils Relative %: 39 %
Platelets: 236 10*3/uL (ref 150–400)
RBC: 5.4 MIL/uL (ref 4.22–5.81)
RDW: 12.5 % (ref 11.5–15.5)
WBC: 7.5 10*3/uL (ref 4.0–10.5)
nRBC: 0 % (ref 0.0–0.2)

## 2022-10-29 LAB — BASIC METABOLIC PANEL
Anion gap: 15 (ref 5–15)
BUN: 14 mg/dL (ref 6–20)
CO2: 20 mmol/L — ABNORMAL LOW (ref 22–32)
Calcium: 9.2 mg/dL (ref 8.9–10.3)
Chloride: 104 mmol/L (ref 98–111)
Creatinine, Ser: 0.76 mg/dL (ref 0.61–1.24)
GFR, Estimated: >60 mL/min (ref >60)
Glucose, Bld: 89 mg/dL (ref 70–99)
Potassium: 3.9 mmol/L (ref 3.5–5.1)
Sodium: 139 mmol/L (ref 135–145)

## 2022-10-29 LAB — LACTIC ACID, PLASMA: Lactic Acid, Venous: 1.9 mmol/L (ref 0.5–1.9)

## 2022-10-29 MED ORDER — KETOROLAC TROMETHAMINE 30 MG/ML IJ SOLN
15.0000 mg | Freq: Once | INTRAMUSCULAR | Status: AC
  Administered 2022-10-29: 15 mg via INTRAVENOUS
  Filled 2022-10-29: qty 1

## 2022-10-29 MED ORDER — AMOXICILLIN-POT CLAVULANATE 875-125 MG PO TABS
1.0000 | ORAL_TABLET | Freq: Two times a day (BID) | ORAL | 0 refills | Status: DC
Start: 2022-10-29 — End: 2022-12-11

## 2022-10-29 MED ORDER — SODIUM CHLORIDE 0.9 % IV BOLUS
1000.0000 mL | Freq: Once | INTRAVENOUS | Status: AC
  Administered 2022-10-29: 1000 mL via INTRAVENOUS

## 2022-10-29 MED ORDER — PIPERACILLIN-TAZOBACTAM 3.375 G IVPB 30 MIN
3.3750 g | Freq: Once | INTRAVENOUS | Status: AC
  Administered 2022-10-29: 3.375 g via INTRAVENOUS
  Filled 2022-10-29: qty 50

## 2022-10-29 NOTE — Discharge Instructions (Signed)
Take antibiotic as prescribed (Augmentin 875mg  twice daily x 7 days).  You may take Ibuprofen and/or Tylenol as needed for pain.  Return to the ER for worsening symptoms, persistent vomiting, fever or other concerns.

## 2022-10-29 NOTE — ED Provider Notes (Signed)
[facility] Provider Note    Event Date/Time   First MD Initiated Contact with Patient 10/29/22 0030     (approximate)   History   Hand Problem   HPI  Samuel Weber is a 37 y.o. male who presents to the ED from home with a chief complaint of right finger redness and swelling for the past 3 days.  Patient is right-hand dominant.  Tetanus is up-to-date.  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  He is not a diabetic.  Denies fever/chills, chest pain, shortness of breath, abdominal pain, nausea, vomiting or dizziness.     Past Medical History   Past Medical History:  Diagnosis Date  . Fentanyl use disorder, severe (HCC)   . Hepatitis C   . Heroin abuse (HCC)   . MRSA infection   . Opioid overdose (HCC)   . Overdose 07/08/2014     Active Problem List   Patient Active Problem List   Diagnosis Date Noted  . Abscess of right hand 11/17/2021  . Opiate abuse, continuous (HCC) 11/17/2021  . Tobacco abuse 11/17/2021  . Opioid dependence with withdrawal (HCC) 03/01/2014     Past Surgical History   Past Surgical History:  Procedure Laterality Date  . APPENDECTOMY    . I & D EXTREMITY Right 11/17/2021   Procedure: RIGHT HAND IRRIGATION AND DEBRIDEMENT;  Surgeon: Betha Loa, MD;  Location: [facility] OR;  Service: Orthopedics;  Laterality: Right;     Home Medications   Prior to Admission medications   Medication Sig Start Date End Date Taking? Authorizing Provider  acetaminophen (TYLENOL) 500 MG tablet Take 1,000 mg by mouth every 6 (six) hours as needed for mild pain.    [provider]  cephALEXin (KEFLEX) 500 MG capsule Take 2 capsules (1,000 mg total) by mouth 2 (two) times daily. 09/04/22   Cuthriell, Delorise Royals, PA-C  ibuprofen (ADVIL) 600 MG tablet Take 1 tablet (600 mg total) by mouth every 6 (six) hours as needed for mild pain or moderate pain. 08/26/21   Fayrene Helper, PA-C  meloxicam (MOBIC) 15 MG  tablet Take 1 tablet (15 mg total) by mouth daily. 09/04/22 09/04/23  Cuthriell, Delorise Royals, PA-C  methocarbamol (ROBAXIN) 500 MG tablet Take 1 tablet (500 mg total) by mouth 4 (four) times daily. 09/04/22   Cuthriell, Delorise Royals, PA-C     Allergies  Patient has no known allergies.   Family History   Family History  Family history unknown: Yes     Physical Exam  Triage Vital Signs: ED Triage Vitals  Enc Vitals Group     BP 10/28/22 2016 139/89     Pulse Rate 10/28/22 2016 69     Resp 10/28/22 2016 20     Temp 10/28/22 2016 97.8 F (36.6 C)     Temp Source 10/28/22 2016 Oral     SpO2 10/28/22 2016 97 %     Weight 10/28/22 2016 180 lb (81.6 kg)     Height 10/28/22 2016 6\' 1"  (1.854 m)     Head Circumference --      Peak Flow --      Pain Score 10/28/22 2021 8     Pain Loc --      Pain Edu? --      Excl. in GC? --     Updated Vital Signs: BP 139/89 (BP Location: Left Arm)   Pulse 69   Temp  97.8 F (36.6 C) (Oral)   Resp 20   Ht 6\' 1"  (1.854 m)   Wt 81.6 kg   SpO2 97%   BMI 23.75 kg/m    General: Awake, no distress.  CV:  RRR.  Good peripheral perfusion.  Resp:  Normal effort.  CTAB. Abd:  Nontender.  No distention.  Other:  Right hand: Right proximal dorsal index finger mildly swollen, warm and erythematous with minimal streaking towards hand.  Full range of motion.  2+ radial pulse.  Brisk, less than 5-second cap refill.   ED Results / Procedures / Treatments  Labs (all labs ordered are listed, but only abnormal results are displayed) Labs Reviewed  CULTURE, BLOOD (ROUTINE X 2)  CULTURE, BLOOD (ROUTINE X 2)  LACTIC ACID, PLASMA  LACTIC ACID, PLASMA  CBC WITH DIFFERENTIAL/PLATELET  BASIC METABOLIC PANEL     EKG  None   RADIOLOGY None   Official radiology report(s): No results found.   PROCEDURES:  Critical Care performed: No  Procedures   MEDICATIONS ORDERED IN ED: Medications  piperacillin-tazobactam (ZOSYN) IVPB 3.375 g (has no  administration in time range)  sodium chloride 0.9 % bolus 1,000 mL (has no administration in time range)  ketorolac (TORADOL) 30 MG/ML injection 15 mg (has no administration in time range)     IMPRESSION / MDM / ASSESSMENT AND PLAN / ED COURSE  I reviewed the triage vital signs and the nursing notes.                             37 year old male presenting with redness and swelling to his right index finger.  Differential diagnosis includes but is not limited to cellulitis, abscess, tenosynovitis, etc.  Personally reviewed patient's records and note patient had an orthopedics office visit for abscess of his right hand in June/2023.  Patient's presentation is most consistent with acute presentation with potential threat to life or bodily function.  Will obtain blood cultures, basic lab work.  Initiate IV Zosyn, fluids, ketorolac for pain.  Will reassess.  Clinical Course as of 10/29/22 0410  Mon Oct 29, 2022  7829 Laboratory results demonstrate normal WBC of 7.5, normal electrolytes, negative lactic acid.  Discharged home on Augmentin with close follow-up with hand surgery.  Strict return precautions given.  Patient verbalizes understanding and agrees with plan of care. [JS]    Clinical Course User Index [JS] Irean Hong, MD     FINAL CLINICAL IMPRESSION(S) / ED DIAGNOSES   Final diagnoses:  Cellulitis of finger of right hand     Rx / DC Orders   ED Discharge Orders     None        Note:  This document was prepared using Dragon voice recognition software and may include unintentional dictation errors.   Irean Hong, MD 10/29/22 [phone]

## 2022-11-03 LAB — CULTURE, BLOOD (ROUTINE X 2)
Culture: NO GROWTH
Special Requests: ADEQUATE

## 2022-11-30 ENCOUNTER — Other Ambulatory Visit: Payer: Self-pay

## 2022-11-30 ENCOUNTER — Encounter (HOSPITAL_BASED_OUTPATIENT_CLINIC_OR_DEPARTMENT_OTHER): Payer: Self-pay

## 2022-11-30 DIAGNOSIS — M545 Low back pain, unspecified: Secondary | ICD-10-CM | POA: Insufficient documentation

## 2022-11-30 DIAGNOSIS — Y92481 Parking lot as the place of occurrence of the external cause: Secondary | ICD-10-CM | POA: Diagnosis not present

## 2022-11-30 DIAGNOSIS — Z041 Encounter for examination and observation following transport accident: Secondary | ICD-10-CM | POA: Diagnosis not present

## 2022-11-30 DIAGNOSIS — S39012A Strain of muscle, fascia and tendon of lower back, initial encounter: Secondary | ICD-10-CM | POA: Diagnosis not present
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
## 2022-12-01 ENCOUNTER — Emergency Department (HOSPITAL_BASED_OUTPATIENT_CLINIC_OR_DEPARTMENT_OTHER): Payer: 59

## 2022-12-01 ENCOUNTER — Emergency Department (HOSPITAL_BASED_OUTPATIENT_CLINIC_OR_DEPARTMENT_OTHER)
Admission: EM | Admit: 2022-12-01 | Discharge: 2022-12-01 | Disposition: A | Discharge status: Home / Self Care | Payer: 59 | Attending: Emergency Medicine | Admitting: Emergency Medicine

## 2022-12-01 DIAGNOSIS — Z041 Encounter for examination and observation following transport accident: Secondary | ICD-10-CM | POA: Diagnosis not present

## 2022-12-01 DIAGNOSIS — S39012A Strain of muscle, fascia and tendon of lower back, initial encounter: Secondary | ICD-10-CM

## 2022-12-01 NOTE — Discharge Instructions (Signed)
Take ibuprofen 600 mg every 6 hours as needed for pain.  Rest.  Follow-up with primary doctor if not improving in the next week. 
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
## 2022-12-10 ENCOUNTER — Emergency Department (HOSPITAL_BASED_OUTPATIENT_CLINIC_OR_DEPARTMENT_OTHER)
Admission: EM | Admit: 2022-12-10 | Discharge: 2022-12-11 | Disposition: A | Discharge status: Home / Self Care | Payer: 59 | Attending: Emergency Medicine | Admitting: Emergency Medicine

## 2022-12-10 ENCOUNTER — Encounter (HOSPITAL_BASED_OUTPATIENT_CLINIC_OR_DEPARTMENT_OTHER): Payer: Self-pay

## 2022-12-10 ENCOUNTER — Other Ambulatory Visit: Payer: Self-pay

## 2022-12-10 DIAGNOSIS — F17210 Nicotine dependence, cigarettes, uncomplicated: Secondary | ICD-10-CM | POA: Diagnosis not present

## 2022-12-10 DIAGNOSIS — X58XXXA Exposure to other specified factors, initial encounter: Secondary | ICD-10-CM | POA: Insufficient documentation

## 2022-12-10 DIAGNOSIS — S39012A Strain of muscle, fascia and tendon of lower back, initial encounter: Secondary | ICD-10-CM | POA: Diagnosis not present

## 2022-12-10 DIAGNOSIS — S3992XA Unspecified injury of lower back, initial encounter: Secondary | ICD-10-CM | POA: Diagnosis not present

## 2022-12-10 NOTE — ED Triage Notes (Signed)
Pt requesting a note to go back to work. Pt seen on 11/30/22 and then returned to work 2-3 days after that. Per pt, he hurt his back again 4 days ago and has been out of work for 4 days and would like a note to go back to work.

## 2022-12-11 NOTE — ED Notes (Signed)
Pt here today for back pain and needs a note to return to work
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
## 2023-02-01 ENCOUNTER — Emergency Department (HOSPITAL_COMMUNITY): Admission: EM | Admit: 2023-02-01 | Discharge: 2023-02-01 | Discharge status: Against Medical Advice | Payer: 59

## 2023-02-01 NOTE — ED Notes (Signed)
Pt seen walking out of the ED entrance.

## 2023-02-02 ENCOUNTER — Emergency Department (HOSPITAL_COMMUNITY)
Admission: EM | Admit: 2023-02-02 | Discharge: 2023-02-02 | Discharge status: Against Medical Advice | Payer: 59 | Attending: Emergency Medicine | Admitting: Emergency Medicine

## 2023-02-02 DIAGNOSIS — Z5321 Procedure and treatment not carried out due to patient leaving prior to being seen by health care provider: Secondary | ICD-10-CM | POA: Diagnosis not present

## 2023-02-02 DIAGNOSIS — M7989 Other specified soft tissue disorders: Secondary | ICD-10-CM | POA: Diagnosis not present
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
## 2023-02-02 NOTE — ED Notes (Signed)
During triage pt and spouse state they are not going to wait and will come back tomorrow when spouse gets off work.

## 2023-03-08 ENCOUNTER — Other Ambulatory Visit: Payer: Self-pay

## 2023-03-08 ENCOUNTER — Encounter (HOSPITAL_COMMUNITY): Payer: Self-pay | Admitting: Emergency Medicine

## 2023-03-08 ENCOUNTER — Emergency Department (HOSPITAL_COMMUNITY)
Admission: EM | Admit: 2023-03-08 | Discharge: 2023-03-08 | Disposition: A | Discharge status: Home / Self Care | Payer: 59 | Attending: Emergency Medicine | Admitting: Emergency Medicine

## 2023-03-08 DIAGNOSIS — R400 Somnolence: Secondary | ICD-10-CM | POA: Diagnosis not present

## 2023-03-08 DIAGNOSIS — R9431 Abnormal electrocardiogram [ECG] [EKG]: Secondary | ICD-10-CM | POA: Diagnosis not present

## 2023-03-08 DIAGNOSIS — T40411A Poisoning by fentanyl or fentanyl analogs, accidental (unintentional), initial encounter: Secondary | ICD-10-CM | POA: Insufficient documentation

## 2023-03-08 LAB — BASIC METABOLIC PANEL
Anion gap: 10 (ref 5–15)
BUN: 14 mg/dL (ref 6–20)
CO2: 25 mmol/L (ref 22–32)
Calcium: 9.3 mg/dL (ref 8.9–10.3)
Chloride: 103 mmol/L (ref 98–111)
Creatinine, Ser: 0.99 mg/dL (ref 0.61–1.24)
GFR, Estimated: >60 mL/min (ref >60)
Glucose, Bld: 115 mg/dL — ABNORMAL HIGH (ref 70–99)
Potassium: 3.4 mmol/L — ABNORMAL LOW (ref 3.5–5.1)
Sodium: 138 mmol/L (ref 135–145)

## 2023-03-08 LAB — CBC WITH DIFFERENTIAL/PLATELET
Abs Immature Granulocytes: 0.05 10*3/uL (ref 0.00–0.07)
Basophils Absolute: 0.1 10*3/uL (ref 0.0–0.1)
Basophils Relative: 1 %
Eosinophils Absolute: 0.7 10*3/uL — ABNORMAL HIGH (ref 0.0–0.5)
Eosinophils Relative: 6 %
HCT: 45 % (ref 39.0–52.0)
Hemoglobin: 15 g/dL (ref 13.0–17.0)
Immature Granulocytes: 0 %
Lymphocytes Relative: 26 %
Lymphs Abs: 3.1 10*3/uL (ref 0.7–4.0)
MCH: 32.1 pg (ref 26.0–34.0)
MCHC: 33.3 g/dL (ref 30.0–36.0)
MCV: 96.4 fL (ref 80.0–100.0)
Monocytes Absolute: 1 10*3/uL (ref 0.1–1.0)
Monocytes Relative: 8 %
Neutro Abs: 7.3 10*3/uL (ref 1.7–7.7)
Neutrophils Relative %: 59 %
Platelets: 330 10*3/uL (ref 150–400)
RBC: 4.67 MIL/uL (ref 4.22–5.81)
RDW: 13.1 % (ref 11.5–15.5)
WBC: 12.2 10*3/uL — ABNORMAL HIGH (ref 4.0–10.5)
nRBC: 0 % (ref 0.0–0.2)

## 2023-03-08 LAB — ETHANOL: Alcohol, Ethyl (B): <10 mg/dL (ref <10)

## 2023-03-08 LAB — CBG MONITORING, ED: Glucose-Capillary: 112 mg/dL — ABNORMAL HIGH (ref 70–99)

## 2023-03-08 NOTE — ED Notes (Signed)
Pt informed at this time a urine sample is needed when possible. Urinal at bedside.

## 2023-03-08 NOTE — ED Notes (Signed)
Pt called wanting to know when he could be discharged. Pt informed we were awaiting a urine sample but I would notify the EDP of his request. EDP notified.

## 2023-03-08 NOTE — Discharge Instructions (Signed)
Return to the emergency department if you experience any new and/or concerning issues.

## 2023-03-08 NOTE — ED Provider Notes (Signed)
[name] EMERGENCY DEPARTMENT AT [facility] Provider Note   CSN: [id number] Arrival date & time: 03/08/23  4098     History  Chief Complaint  Patient presents with   Drug Overdose    Samuel Weber is a 37 y.o. male.  Patient is a 37 year old male with past medical history of opioid abuse apparently in recovery.  Patient was said to have relapsed this evening and was found unresponsive by family.  He was administered nasal Narcan by both family members and EMS.  Patient arrives here somnolent, but arousable.  He offers little history secondary to altered mental status.  The history is provided by the patient.       Home Medications Prior to Admission medications   Medication Sig Start Date End Date Taking? Authorizing Provider  acetaminophen (TYLENOL) 500 MG tablet Take 1,000 mg by mouth every 6 (six) hours as needed for mild pain.    [provider]  ibuprofen (ADVIL) 600 MG tablet Take 1 tablet (600 mg total) by mouth every 6 (six) hours as needed for mild pain or moderate pain. 08/26/21   Fayrene Helper, PA-C  meloxicam (MOBIC) 15 MG tablet Take 1 tablet (15 mg total) by mouth daily. 09/04/22 09/04/23  Cuthriell, Delorise Royals, PA-C  methocarbamol (ROBAXIN) 500 MG tablet Take 1 tablet (500 mg total) by mouth 4 (four) times daily. 09/04/22   Cuthriell, Delorise Royals, PA-C      Allergies    Patient has no known allergies.    Review of Systems   Review of Systems  Unable to perform ROS: Acuity of condition    Physical Exam Updated Vital Signs BP (!) 128/92   Pulse 71   Resp (!) 30   Ht 6\' 1"  (1.854 m)   Wt 81.6 kg   SpO2 97%   BMI 23.73 kg/m  Physical Exam Vitals and nursing note reviewed.  Constitutional:      General: He is not in acute distress.    Appearance: He is well-developed. He is not diaphoretic.     Comments: Patient is somnolent, but arousable.  HENT:     Head: Normocephalic and atraumatic.  Cardiovascular:     Rate and Rhythm: Normal  rate and regular rhythm.     Heart sounds: No murmur heard.    No friction rub.  Pulmonary:     Effort: Pulmonary effort is normal. No respiratory distress.     Breath sounds: Normal breath sounds. No wheezing or rales.  Abdominal:     General: Bowel sounds are normal. There is no distension.     Palpations: Abdomen is soft.     Tenderness: There is no abdominal tenderness.  Musculoskeletal:        General: Normal range of motion.     Cervical back: Normal range of motion and neck supple.  Skin:    General: Skin is warm and dry.  Neurological:     Coordination: Coordination normal.     Comments: Patient is somnolent but arousable.  He localizes to noxious stimuli.     ED Results / Procedures / Treatments   Labs (all labs ordered are listed, but only abnormal results are displayed) Labs Reviewed - No data to display  EKG EKG Interpretation Date/Time:  Friday March 08 2023 00:55:33 EDT Ventricular Rate:  78 PR Interval:  180 QRS Duration:  90 QT Interval:  375 QTC Calculation: 428 R Axis:   75  Text Interpretation: Sinus rhythm Normal ECG Confirmed by  Geoffery Lyons ([id number]) on 03/08/2023 1:15:49 AM  Radiology No results found.  Procedures Procedures    Medications Ordered in ED Medications - No data to display  ED Course/ Medical Decision Making/ A&P  Patient is a 37 year old male with history of opioid abuse presenting by EMS after an overdose.  Patient took IV fentanyl, then went unresponsive.  He received a reported 20 mg of Narcan prior to arrival to the ER.  Laboratory studies obtained including CBC and basic metabolic panel, both of which are unremarkable.  Serum EtOH is negative.  Patient observed in the ER and was maintaining his airway.  He is now waking up and requesting to go home.  Patient will be discharged with as needed return.  He was advised against the use of illicit drugs.  Final Clinical Impression(s) / ED Diagnoses Final diagnoses:  None     Rx / DC Orders ED Discharge Orders     None         Geoffery Lyons, MD 03/08/23 [phone]

## 2023-03-08 NOTE — ED Notes (Signed)
Pt vomited large amount of partially digested spaghetti, pt also had large loose stool- pt was cleaned up, bed linen changed.

## 2023-03-08 NOTE — ED Triage Notes (Signed)
Pt BIB EMS for drug overdose. Unknown what pt took, pt was given an estimated 20mg  Narcan by family. Periodontically needing rescue breaths by BVM

## 2023-10-13 ENCOUNTER — Emergency Department

## 2023-10-13 ENCOUNTER — Inpatient Hospital Stay
Admission: EM | Admit: 2023-10-13 | Discharge: 2023-10-16 | DRG: 894 | Discharge status: Against Medical Advice | Attending: Internal Medicine | Admitting: Internal Medicine

## 2023-10-13 ENCOUNTER — Other Ambulatory Visit: Payer: Self-pay

## 2023-10-13 DIAGNOSIS — Z5329 Procedure and treatment not carried out because of patient's decision for other reasons: Secondary | ICD-10-CM | POA: Diagnosis not present

## 2023-10-13 DIAGNOSIS — E876 Hypokalemia: Secondary | ICD-10-CM | POA: Diagnosis not present

## 2023-10-13 DIAGNOSIS — F1990 Other psychoactive substance use, unspecified, uncomplicated: Secondary | ICD-10-CM | POA: Diagnosis present

## 2023-10-13 DIAGNOSIS — R0789 Other chest pain: Secondary | ICD-10-CM | POA: Diagnosis not present

## 2023-10-13 DIAGNOSIS — F17210 Nicotine dependence, cigarettes, uncomplicated: Secondary | ICD-10-CM | POA: Diagnosis present

## 2023-10-13 DIAGNOSIS — R7989 Other specified abnormal findings of blood chemistry: Secondary | ICD-10-CM | POA: Diagnosis present

## 2023-10-13 DIAGNOSIS — K760 Fatty (change of) liver, not elsewhere classified: Secondary | ICD-10-CM | POA: Diagnosis present

## 2023-10-13 DIAGNOSIS — R9431 Abnormal electrocardiogram [ECG] [EKG]: Secondary | ICD-10-CM | POA: Diagnosis present

## 2023-10-13 DIAGNOSIS — M62838 Other muscle spasm: Secondary | ICD-10-CM | POA: Diagnosis present

## 2023-10-13 DIAGNOSIS — F1123 Opioid dependence with withdrawal: Principal | ICD-10-CM | POA: Diagnosis present

## 2023-10-13 DIAGNOSIS — K920 Hematemesis: Secondary | ICD-10-CM | POA: Diagnosis present

## 2023-10-13 DIAGNOSIS — F1910 Other psychoactive substance abuse, uncomplicated: Secondary | ICD-10-CM | POA: Diagnosis present

## 2023-10-13 DIAGNOSIS — R251 Tremor, unspecified: Secondary | ICD-10-CM | POA: Diagnosis present

## 2023-10-13 DIAGNOSIS — Z720 Tobacco use: Secondary | ICD-10-CM | POA: Diagnosis present

## 2023-10-13 DIAGNOSIS — Z79899 Other long term (current) drug therapy: Secondary | ICD-10-CM

## 2023-10-13 DIAGNOSIS — Z8614 Personal history of Methicillin resistant Staphylococcus aureus infection: Secondary | ICD-10-CM

## 2023-10-13 DIAGNOSIS — Z8619 Personal history of other infectious and parasitic diseases: Secondary | ICD-10-CM

## 2023-10-13 DIAGNOSIS — F1193 Opioid use, unspecified with withdrawal: Principal | ICD-10-CM | POA: Diagnosis present

## 2023-10-13 DIAGNOSIS — H5704 Mydriasis: Secondary | ICD-10-CM | POA: Diagnosis present

## 2023-10-13 DIAGNOSIS — D72829 Elevated white blood cell count, unspecified: Secondary | ICD-10-CM | POA: Diagnosis present

## 2023-10-13 DIAGNOSIS — E872 Acidosis, unspecified: Secondary | ICD-10-CM | POA: Diagnosis present

## 2023-10-13 HISTORY — DX: Other psychoactive substance use, unspecified, uncomplicated: F19.90

## 2023-10-13 LAB — CBC
HCT: 47.1 % (ref 39.0–52.0)
HCT: 45.1 % (ref 39.0–52.0)
Hemoglobin: 15.9 g/dL (ref 13.0–17.0)
Hemoglobin: 15 g/dL (ref 13.0–17.0)
MCH: 31.9 pg (ref 26.0–34.0)
MCH: 31.6 pg (ref 26.0–34.0)
MCHC: 33.8 g/dL (ref 30.0–36.0)
MCHC: 33.3 g/dL (ref 30.0–36.0)
MCV: 94.6 fL (ref 80.0–100.0)
MCV: 95.1 fL (ref 80.0–100.0)
Platelets: 515 10*3/uL — ABNORMAL HIGH (ref 150–400)
Platelets: 373 10*3/uL (ref 150–400)
RBC: 4.98 MIL/uL (ref 4.22–5.81)
RBC: 4.74 MIL/uL (ref 4.22–5.81)
RDW: 12.7 % (ref 11.5–15.5)
RDW: 12.6 % (ref 11.5–15.5)
WBC: 17.9 10*3/uL — ABNORMAL HIGH (ref 4.0–10.5)
WBC: 16.5 10*3/uL — ABNORMAL HIGH (ref 4.0–10.5)
nRBC: 0 % (ref 0.0–0.2)
nRBC: 0 % (ref 0.0–0.2)

## 2023-10-13 LAB — COMPREHENSIVE METABOLIC PANEL WITH GFR
ALT: 20 U/L (ref 0–44)
AST: 30 U/L (ref 15–41)
Albumin: 4.9 g/dL (ref 3.5–5.0)
Alkaline Phosphatase: 79 U/L (ref 38–126)
Anion gap: 16 — ABNORMAL HIGH (ref 5–15)
BUN: 15 mg/dL (ref 6–20)
CO2: 21 mmol/L — ABNORMAL LOW (ref 22–32)
Calcium: 10 mg/dL (ref 8.9–10.3)
Chloride: 104 mmol/L (ref 98–111)
Creatinine, Ser: 0.99 mg/dL (ref 0.61–1.24)
GFR, Estimated: >60 mL/min (ref >60)
Glucose, Bld: 187 mg/dL — ABNORMAL HIGH (ref 70–99)
Potassium: 3.2 mmol/L — ABNORMAL LOW (ref 3.5–5.1)
Sodium: 141 mmol/L (ref 135–145)
Total Bilirubin: 1 mg/dL (ref 0.0–1.2)
Total Protein: 9.1 g/dL — ABNORMAL HIGH (ref 6.5–8.1)

## 2023-10-13 LAB — LACTIC ACID, PLASMA: Lactic Acid, Venous: 5.2 mmol/L (ref 0.5–1.9)

## 2023-10-13 LAB — MAGNESIUM: Magnesium: 2.2 mg/dL (ref 1.7–2.4)

## 2023-10-13 LAB — TYPE AND SCREEN
ABO/RH(D): A POS
Antibody Screen: NEGATIVE

## 2023-10-13 LAB — TROPONIN I (HIGH SENSITIVITY)
Troponin I (High Sensitivity): 11 ng/L (ref <18)
Troponin I (High Sensitivity): 20 ng/L — ABNORMAL HIGH

## 2023-10-13 MED ORDER — IOHEXOL 300 MG/ML  SOLN
75.0000 mL | Freq: Once | INTRAMUSCULAR | Status: AC | PRN
  Administered 2023-10-13: 75 mL via INTRAVENOUS

## 2023-10-13 MED ORDER — CLONIDINE HCL 0.1 MG PO TABS
0.1000 mg | ORAL_TABLET | Freq: Four times a day (QID) | ORAL | Status: AC
  Administered 2023-10-13 – 2023-10-15 (×10): 0.1 mg via ORAL
  Filled 2023-10-13 (×10): qty 1

## 2023-10-13 MED ORDER — ONDANSETRON HCL 4 MG/2ML IJ SOLN: 4.0000 mg | Freq: Once | INTRAMUSCULAR | Status: DC

## 2023-10-13 MED ORDER — SODIUM CHLORIDE 0.9 % IV SOLN: INTRAVENOUS | Status: AC

## 2023-10-13 MED ORDER — METHOCARBAMOL 500 MG PO TABS: 500.0000 mg | ORAL_TABLET | Freq: Three times a day (TID) | ORAL | Status: DC | PRN

## 2023-10-13 MED ORDER — LORAZEPAM 2 MG/ML IJ SOLN
1.0000 mg | Freq: Four times a day (QID) | INTRAMUSCULAR | Status: DC
  Administered 2023-10-13 – 2023-10-15 (×8): 1 mg via INTRAVENOUS
  Filled 2023-10-13 (×8): qty 1

## 2023-10-13 MED ORDER — DIPHENHYDRAMINE HCL 50 MG/ML IJ SOLN
25.0000 mg | Freq: Four times a day (QID) | INTRAMUSCULAR | Status: DC | PRN
  Administered 2023-10-16: 25 mg via INTRAVENOUS
  Filled 2023-10-13: qty 1

## 2023-10-13 MED ORDER — POTASSIUM CHLORIDE 10 MEQ/100ML IV SOLN
10.0000 meq | INTRAVENOUS | Status: AC
  Administered 2023-10-13 (×2): 10 meq via INTRAVENOUS
  Filled 2023-10-13 (×2): qty 100

## 2023-10-13 MED ORDER — ALUM & MAG HYDROXIDE-SIMETH 200-200-20 MG/5ML PO SUSP
30.0000 mL | Freq: Once | ORAL | Status: AC
  Administered 2023-10-13: 30 mL via ORAL
  Filled 2023-10-13: qty 30

## 2023-10-13 MED ORDER — PROCHLORPERAZINE EDISYLATE 10 MG/2ML IJ SOLN
5.0000 mg | Freq: Once | INTRAMUSCULAR | Status: AC
  Administered 2023-10-13: 5 mg via INTRAVENOUS
  Filled 2023-10-13: qty 2

## 2023-10-13 MED ORDER — NITROGLYCERIN 0.4 MG SL SUBL: 0.4000 mg | SUBLINGUAL_TABLET | SUBLINGUAL | Status: DC | PRN

## 2023-10-13 MED ORDER — GABAPENTIN 300 MG PO CAPS
600.0000 mg | ORAL_CAPSULE | Freq: Every day | ORAL | Status: DC
  Administered 2023-10-13 – 2023-10-15 (×3): 600 mg via ORAL
  Filled 2023-10-13 (×3): qty 2

## 2023-10-13 MED ORDER — MORPHINE SULFATE (PF) 4 MG/ML IV SOLN
4.0000 mg | Freq: Once | INTRAVENOUS | Status: AC
  Administered 2023-10-13: 4 mg via INTRAVENOUS
  Filled 2023-10-13: qty 1

## 2023-10-13 MED ORDER — CLONIDINE HCL 0.1 MG PO TABS
0.1000 mg | ORAL_TABLET | Freq: Every day | ORAL | Status: DC
Start: 2023-10-18 — End: 2023-10-20

## 2023-10-13 MED ORDER — ACETAMINOPHEN 325 MG PO TABS: 650.0000 mg | ORAL_TABLET | Freq: Four times a day (QID) | ORAL | Status: DC | PRN

## 2023-10-13 MED ORDER — SODIUM CHLORIDE 0.9 % IV BOLUS
1000.0000 mL | Freq: Once | INTRAVENOUS | Status: AC
  Administered 2023-10-13: 1000 mL via INTRAVENOUS

## 2023-10-13 MED ORDER — HYDROXYZINE HCL 25 MG PO TABS
25.0000 mg | ORAL_TABLET | Freq: Four times a day (QID) | ORAL | Status: DC | PRN
  Administered 2023-10-13 – 2023-10-16 (×2): 25 mg via ORAL
  Filled 2023-10-13 (×2): qty 1

## 2023-10-13 MED ORDER — NICOTINE 21 MG/24HR TD PT24
21.0000 mg | MEDICATED_PATCH | Freq: Every day | TRANSDERMAL | Status: DC
  Administered 2023-10-13 – 2023-10-16 (×3): 21 mg via TRANSDERMAL
  Filled 2023-10-13 (×4): qty 1

## 2023-10-13 MED ORDER — LIDOCAINE VISCOUS HCL 2 % MT SOLN
15.0000 mL | Freq: Once | OROMUCOSAL | Status: AC
  Administered 2023-10-13: 15 mL via ORAL
  Filled 2023-10-13: qty 15

## 2023-10-13 MED ORDER — CLONIDINE HCL 0.1 MG PO TABS
0.1000 mg | ORAL_TABLET | ORAL | Status: DC
  Administered 2023-10-16: 0.1 mg via ORAL
  Filled 2023-10-13: qty 1

## 2023-10-13 MED ORDER — PANTOPRAZOLE SODIUM 40 MG IV SOLR
40.0000 mg | Freq: Two times a day (BID) | INTRAVENOUS | Status: DC
  Administered 2023-10-14 – 2023-10-16 (×5): 40 mg via INTRAVENOUS
  Filled 2023-10-13 (×6): qty 10

## 2023-10-13 MED ORDER — PANTOPRAZOLE SODIUM 40 MG IV SOLR
40.0000 mg | Freq: Once | INTRAVENOUS | Status: AC
  Administered 2023-10-13: 40 mg via INTRAVENOUS
  Filled 2023-10-13: qty 10

## 2023-10-13 MED ORDER — LOPERAMIDE HCL 2 MG PO CAPS: 2.0000 mg | ORAL_CAPSULE | ORAL | Status: DC | PRN

## 2023-10-13 MED ORDER — DIPHENHYDRAMINE HCL 50 MG/ML IJ SOLN
25.0000 mg | Freq: Once | INTRAMUSCULAR | Status: DC
  Administered 2023-10-13: 25 mg via INTRAVENOUS
  Filled 2023-10-13: qty 1

## 2023-10-13 MED ORDER — DICYCLOMINE HCL 20 MG PO TABS: 20.0000 mg | ORAL_TABLET | Freq: Four times a day (QID) | ORAL | Status: DC | PRN

## 2023-10-13 MED ORDER — POTASSIUM CHLORIDE CRYS ER 20 MEQ PO TBCR
40.0000 meq | EXTENDED_RELEASE_TABLET | Freq: Once | ORAL | Status: AC
  Administered 2023-10-13: 40 meq via ORAL
  Filled 2023-10-13: qty 2

## 2023-10-13 MED ORDER — LORAZEPAM 2 MG/ML IJ SOLN: 1.0000 mg | INTRAMUSCULAR | Status: DC | PRN

## 2023-10-13 NOTE — H&P (Addendum)
 History and Physical    Samuel Weber:[id number] DOB: Nov 20, 1985 DOA: 10/13/2023  Referring MD/NP/PA:   PCP: Pcp, No   Patient coming from:  The patient is coming from home.     Chief Complaint: Opioid withdrawal.  HPI: Samuel Weber is a 38 y.o. male with medical history significant of polysubstance abuse, IV drug user, opioid abuse, HCV, MRSA, overdose, tobacco abuse, who presents with opioid withdrawal.  Pt states that he has intermittent nausea, vomiting  for several weeks, which has worsened in the past several days.  Associated with intermittent hematemesis with dark-colored blood.  He also has abdominal pain, which is diffuse, intermittent, moderate, aching, nonradiating.  Denies diarrhea.  He reports chest pain, which is located in front chest, intermittent, sharp, moderate, nonradiating, not associated with SOB or coughing.  No fever or chills. Pt states that frequent vomiting causes burning pain in back of his throat and chest. No symptoms of UTI. Per EDP, his mother reported that patient regularly uses IV heroin and fentanyl , with his last reported use yesterday.  Patient has diaphoresis and dilated pupil.  Patient has multiple track marks in both arms.   Data reviewed independently and ED Course: pt was found to have WBC 17.9, hemoglobin 15.9, potassium 3.2, magnesium 2.2, GFR> 60, lactic acid 5.2, troponin 11, temperature 97.5, blood pressure 147/70, heart rate 54 --> 60, RR 24 --> 12, oxygen saturation 99% on room air.  Chest x-ray negative.  CT of abdomen/pelvis is negative for acute intra-abdominal issues.  Patient is placed in telemetry bed for observation.   EKG: I have personally reviewed.  Sinus rhythm, QTc 536, possible left atrial enlargement, no ischemic change.   Review of Systems:   General: no fevers, chills, no body weight gain, has poor appetite, has fatigue HEENT: no blurry vision, hearing changes Respiratory: no dyspnea, coughing, wheezing CV: has  chest pain, no palpitations GI: has nausea, vomiting, abdominal pain, no diarrhea, constipation GU: no dysuria, burning on urination, increased urinary frequency, hematuria  Ext: no leg edema Neuro: no unilateral weakness, numbness, or tingling, no vision change or hearing loss Skin: has multiple track marks in both arms MSK: No muscle spasm, no deformity, no limitation of range of movement in spin Heme: No easy bruising.  Travel history: No recent long distant travel.   Allergy: No Known Allergies  Past Medical History:  Diagnosis Date   Fentanyl  use disorder, severe (HCC)    Hepatitis C    Heroin abuse (HCC)    IVDU (intravenous drug user)    MRSA infection    Opioid overdose (HCC)    Overdose 07/08/2014    Past Surgical History:  Procedure Laterality Date   APPENDECTOMY     I & D EXTREMITY Right 11/17/2021   Procedure: RIGHT HAND IRRIGATION AND DEBRIDEMENT;  Surgeon: Brunilda Capra, MD;  Location: [facility] OR;  Service: Orthopedics;  Laterality: Right;    Social History:  reports that he has been smoking cigarettes. He does not have any smokeless tobacco history on file. He reports that he does not currently use drugs after having used the following drugs: IV, Cocaine, Marijuana, and Heroin. He reports that he does not drink alcohol.  Family History:  Family History  Problem Relation Age of Onset   Hypertension Mother    Diabetes Mother    Hypertension Father      Prior to Admission medications   Medication Sig Start Date End Date Taking? Authorizing Provider  acetaminophen  (TYLENOL ) 500 MG  tablet Take 1,000 mg by mouth every 6 (six) hours as needed for mild pain.    [provider]  ibuprofen  (ADVIL ) 600 MG tablet Take 1 tablet (600 mg total) by mouth every 6 (six) hours as needed for mild pain or moderate pain. 08/26/21   Debbra Fairy, PA-C  methocarbamol  (ROBAXIN ) 500 MG tablet Take 1 tablet (500 mg total) by mouth 4 (four) times daily. 09/04/22   Josephine Nicolas,  PA-C    Physical Exam: Vitals:   10/13/23 1434 10/13/23 1440 10/13/23 1655 10/13/23 1730  BP: (!) 182/78 (!) 155/73  (!) 147/70  Pulse: (!) 58 (!) 54  60  Resp: (!) 24 11  12   Temp: (!) 97.5 F (36.4 C)     TempSrc: Oral     SpO2: 100% 100%  99%  Weight: 68 kg     Height: 5\' 2"  (1.575 m)  6\' 1"  (1.854 m)    General: Not in acute distress HEENT:       Eyes: PERRL, EOMI, no jaundice       ENT: No discharge from the ears and nose, no pharynx injection, no tonsillar enlargement.        Neck: No JVD, no bruit, no mass felt. Heme: No neck lymph node enlargement. Cardiac: S1/S2, RRR, No murmurs, No gallops or rubs. Respiratory: No rales, wheezing, rhonchi or rubs. GI: Soft, nondistended, has diffused abdominal tenderness, no rebound pain, no organomegaly, BS present. GU: No hematuria Ext: No pitting leg edema bilaterally. 1+DP/PT pulse bilaterally. Musculoskeletal: No joint deformities, No joint redness or warmth, no limitation of ROM in spin. Skin: has multiple track marks in both arms Neuro: Alert, oriented X3, cranial nerves II-XII grossly intact, moves all extremities normally. Psych: Patient is not psychotic, no suicidal or hemocidal ideation.  Labs on Admission: I have personally reviewed following labs and imaging studies  CBC: Recent Labs  Lab 10/13/23 1453  WBC 17.9*  HGB 15.9  HCT 47.1  MCV 94.6  PLT 515*   Basic Metabolic Panel: Recent Labs  Lab 10/13/23 1453  NA 141  K 3.2*  CL 104  CO2 21*  GLUCOSE 187*  BUN 15  CREATININE 0.99  CALCIUM 10.0  MG 2.2   GFR: Estimated Creatinine Clearance: 99.2 mL/min (by C-G formula based on SCr of 0.99 mg/dL). Liver Function Tests: Recent Labs  Lab 10/13/23 1453  AST 30  ALT 20  ALKPHOS 79  BILITOT 1.0  PROT 9.1*  ALBUMIN 4.9   No results for input(s): "LIPASE", "AMYLASE" in the last 168 hours. No results for input(s): "AMMONIA" in the last 168 hours. Coagulation Profile: No results for input(s): "INR",  "PROTIME" in the last 168 hours. Cardiac Enzymes: No results for input(s): "CKTOTAL", "CKMB", "CKMBINDEX", "TROPONINI" in the last 168 hours. BNP (last 3 results) No results for input(s): "PROBNP" in the last 8760 hours. HbA1C: No results for input(s): "HGBA1C" in the last 72 hours. CBG: No results for input(s): "GLUCAP" in the last 168 hours. Lipid Profile: No results for input(s): "CHOL", "HDL", "LDLCALC", "TRIG", "CHOLHDL", "LDLDIRECT" in the last 72 hours. Thyroid Function Tests: No results for input(s): "TSH", "T4TOTAL", "FREET4", "T3FREE", "THYROIDAB" in the last 72 hours. Anemia Panel: No results for input(s): "VITAMINB12", "FOLATE", "FERRITIN", "TIBC", "IRON", "RETICCTPCT" in the last 72 hours. Urine analysis:    Component Value Date/Time   COLORURINE YELLOW 09/12/2007 2153   APPEARANCEUR CLEAR 09/12/2007 2153   LABSPEC 1.045 (H) 09/12/2007 2153   PHURINE 7.0 09/12/2007 2153  GLUCOSEU NEGATIVE 09/12/2007 2153   HGBUR NEGATIVE 09/12/2007 2153   BILIRUBINUR NEGATIVE 09/12/2007 2153   KETONESUR NEGATIVE 09/12/2007 2153   PROTEINUR 30 (A) 09/12/2007 2153   UROBILINOGEN 1.0 09/12/2007 2153   NITRITE NEGATIVE 09/12/2007 2153   LEUKOCYTESUR NEGATIVE 09/12/2007 2153   Sepsis Labs: @LABRCNTIP (procalcitonin:4,lacticidven:4) )No results found for this or any previous visit (from the past 240 hours).   Radiological Exams on Admission:   Assessment/Plan Active Problems:   Opioid withdrawal (HCC)   IVDU (intravenous drug user)   Polysubstance abuse (HCC)   Atypical chest pain   Hypokalemia   Elevated lactic acid level   Leukocytosis   Hematemesis   Prolonged QT interval   Tobacco abuse   Assessment and Plan:   Opioid withdrawal [facility]): Patient has diaphoresis, nausea, vomiting, abdominal pain, dilated pupil, clinically consistent with opioid withdrawal.  Patient has QTc prolongation, which is likely due to hypokalemia.  Will replete potassium and re-check EKG.  If QTc  prolongation resolves, will start Suboxone .  -Place in telemetry bed for observation - Ativan  1 mg q6h - Neurontin  600 mg at bedtime - As needed Tylenol  - Robaxin  500 mg every 8 hours for muscle spasm - Patient received 4 mg of morphine  in ED - recheck EKG after replating potassium --> started Robaxin  if QTc is okay - IV fluid: 2 L normal saline, then 125 cc/h - Start clonidine  detox protocol (discussed with the pharmacist), together with as needed Bentyl , hydroxyzine , Imodium  and Robaxin   Addendum: Repeated the EKG showed QTc 465. - start Suboxone    IVDU (intravenous drug user) and polysubstance abuse (HCC) -Did counseling about importance of quitting substance use - Consult TOC - Check UDS  Atypical chest pain: Initial troponin 11.  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 - Protonix  40 mg twice daily - trend trop - check UDS - will not give ASA due to hematemesis  Hypokalemia: Potassium 3.2.  Magnesium 2.2 -Repleted potassium - Check phosphorus level  Elevated lactic acid level: Lactic acid of 5.2.  WBC 17.9, no fever, does not seem to have sepsis for may be due to dehydration. -IV fluid: as above - Trend lactic acid level  Leukocytosis: WBC 17.9.  No fever.  No clear source of infection identified.  Patient has multiple track marks in both arms, but no one seems to be infected however patient is IV drug user, has high risk of having bacteremia.  Will get a procalcitonin level.  If procalcitonin level will start broad antibiotics empirically. - Blood culture - Follow-up procalcitonin level  Hematemesis: Hemoglobin stable 15.9.  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 - IVF: as above - Start IV pantoprazole  40 mg bid - As needed Benadryl  IV for nausea - Avoid NSAIDs and SQ heparin  - Maintain IV access (2 large bore IVs if possible). - Monitor closely and follow q6h cbc, transfuse as necessary, if Hgb<7.0 - LaB: INR, PTT and type screen  Prolonged QT  interval: QTc 536, may be due to hypokalemia. - Repleted potassium - Hold trazodone - Avoid QT prolonging medications. - Recheck EKG after giving potassium  Tobacco abuse - Did counseling about importance of quitting tobacco use - Nicotine  patch       DVT ppx: SCD  Code Status: Full code   Family Communication:     not done, no family member is at bed side.     Disposition Plan:  Anticipate discharge back to previous environment  Consults called:  none  Admission status and Level of  care: Telemetry Medical:    for obs       Dispo: The patient is from: Home              Anticipated d/c is to: Home              Anticipated d/c date is: 1 day              Patient currently is not medically stable to d/c.    Severity of Illness:  The appropriate patient status for this patient is OBSERVATION. Observation status is judged to be reasonable and necessary in order to provide the required intensity of service to ensure the patient's safety. The patient's presenting symptoms, physical exam findings, and initial radiographic and laboratory data in the context of their medical condition is felt to place them at decreased risk for further clinical deterioration. Furthermore, it is anticipated that the patient will be medically stable for discharge from the hospital within 2 midnights of admission.        Date of Service 10/13/2023    Fidencio Hue [facility]   If 7PM-7AM, please contact night-coverage [email or web address] 10/13/2023, 7:05 PM

## 2023-10-13 NOTE — ED Triage Notes (Signed)
 Pt to ED with mother for fentanyl  and heroin withdrawel. Pt states throwing up blood and chest pain since yesterday. Pt has track marks on arms. Diaphoretic, pupils dilated. Last fentanyl  and heroin use yesterday, denies other drugs and alcohol. Pt lives at home with mother and stating would like rehab.

## 2023-10-13 NOTE — ED Provider Notes (Signed)
 [facility] Provider Note    Event Date/Time   First MD Initiated Contact with Patient 10/13/23 1500     (approximate)   History   Chief Complaint fentanyl  withdrawel, Hematemesis, and Chest Pain   HPI  Samuel Weber is a 38 y.o. male with past medical history IVDU and hepatitis C who presents to the ED complaining of chest pain.  Patient reports that he has been vomiting for the past few days, has started to notice blood in his vomit and have discomfort in his chest.  Mother reports that she is not sure exactly how long the vomiting blood has been going on for, but she witnessed multiple episodes this morning of dark emesis mixed with blood.  Patient has been unable to keep down any oral intake today and has been slower to respond than usual.  Mother does report that he regularly uses IV heroin and fentanyl , with his last reported use yesterday.  Mother is not aware of any fevers, however he has been diaphoretic today.     Physical Exam   Triage Vital Signs: ED Triage Vitals  Encounter Vitals Group     BP 10/13/23 1434 (!) 182/78     Systolic BP Percentile --      Diastolic BP Percentile --      Pulse Rate 10/13/23 1434 (!) 58     Resp 10/13/23 1434 (!) 24     Temp 10/13/23 1434 (!) 97.5 F (36.4 C)     Temp Source 10/13/23 1434 Oral     SpO2 10/13/23 1434 100 %     Weight 10/13/23 1434 150 lb (68 kg)     Height 10/13/23 1434 5\' 2"  (1.575 m)     Head Circumference --      Peak Flow --      Pain Score 10/13/23 1436 8     Pain Loc --      Pain Education --      Exclude from Growth Chart --     Most recent vital signs: Vitals:   10/13/23 1440 10/13/23 1730  BP: (!) 155/73 (!) 147/70  Pulse: (!) 54 60  Resp: 11 12  Temp:    SpO2: 100% 99%    Constitutional: Awake and alert, slow to respond and diaphoretic. Eyes: Conjunctivae are normal. Head: Atraumatic. Nose: No congestion/rhinnorhea. Mouth/Throat: Mucous membranes are  moist. Cardiovascular: Normal rate, regular rhythm. Grossly normal heart sounds.  2+ radial pulses bilaterally. Respiratory: Normal respiratory effort.  No retractions. Lungs CTAB. Gastrointestinal: Soft and diffusely tender to palpation with no rebound or guarding. No distention. Musculoskeletal: No lower extremity tenderness nor edema.  Track marks to bilateral forearms with no erythema, edema, or fluctuance. Neurologic:  Normal speech and language. No gross focal neurologic deficits are appreciated.    ED Results / Procedures / Treatments   Labs (all labs ordered are listed, but only abnormal results are displayed) Labs Reviewed  CBC - Abnormal; Notable for the following components:      Result Value   WBC 17.9 (*)    Platelets 515 (*)    All other components within normal limits  COMPREHENSIVE METABOLIC PANEL WITH GFR - Abnormal; Notable for the following components:   Potassium 3.2 (*)    CO2 21 (*)    Glucose, Bld 187 (*)    Total Protein 9.1 (*)    Anion gap 16 (*)    All other components within normal limits  LACTIC ACID, PLASMA -  Abnormal; Notable for the following components:   Lactic Acid, Venous 5.2 (*)    All other components within normal limits  CULTURE, BLOOD (ROUTINE X 2)  CULTURE, BLOOD (ROUTINE X 2)  MAGNESIUM  LACTIC ACID, PLASMA  TYPE AND SCREEN  TROPONIN I (HIGH SENSITIVITY)  TROPONIN I (HIGH SENSITIVITY)     EKG  ED ECG REPORT I, Twilla Galea, the attending physician, personally viewed and interpreted this ECG.   Date: 10/13/2023  EKG Time: 14:38  Rate: 53  Rhythm: sinus bradycardia  Axis: Normal  Intervals: Prolonged QT  ST&T Change: None  RADIOLOGY Chest x-ray reviewed and interpreted by me with no infiltrate, edema, or effusion.  PROCEDURES:  Critical Care performed: No  Procedures   MEDICATIONS ORDERED IN ED: Medications  prochlorperazine  (COMPAZINE ) injection 5 mg (has no administration in time range)  diphenhydrAMINE   (BENADRYL ) injection 25 mg (has no administration in time range)  pantoprazole  (PROTONIX ) injection 40 mg (40 mg Intravenous Given 10/13/23 1536)  morphine  (PF) 4 MG/ML injection 4 mg (4 mg Intravenous Given 10/13/23 1536)  sodium chloride  0.9 % bolus 1,000 mL (0 mLs Intravenous Stopped 10/13/23 1756)  alum & mag hydroxide-simeth (MAALOX/MYLANTA) 200-200-20 MG/5ML suspension 30 mL (30 mLs Oral Given 10/13/23 1617)    And  lidocaine  (XYLOCAINE ) 2 % viscous mouth solution 15 mL (15 mLs Oral Given 10/13/23 1618)  iohexol  (OMNIPAQUE ) 300 MG/ML solution 75 mL (75 mLs Intravenous Contrast Given 10/13/23 1701)     IMPRESSION / MDM / ASSESSMENT AND PLAN / ED COURSE  I reviewed the triage vital signs and the nursing notes.                              38 y.o. male with past medical history of IV drug use and hepatitis C who presents to the ED complaining of nausea and vomiting with hematemesis and chest pain over at least the past 24 hours.  Patient's presentation is most consistent with acute presentation with potential threat to life or bodily function.  Differential diagnosis includes, but is not limited to, opiate withdrawal, dehydration, electrolyte abnormality, AKI, hepatitis, pancreatitis, sepsis, bacteremia, Mallory-Weiss tear, Boerhaave syndrome, PUD, gastritis, upper GI bleed.  Patient ill-appearing but in no acute distress, actively vomiting on my assessment with dark emesis but no obvious blood.  Vital signs remarkable for mild bradycardia and mild tachypnea.  He is not in any respiratory distress and maintaining oxygen saturations at 100% on room air.  He has track marks to both forearms but no obvious signs of infection, however he has high risk for bacteremia and we will draw blood cultures.  EKG shows sinus bradycardia with prolonged QT, will avoid QT prolonging antiemetics and treat with IV morphine  for now.  Chest x-ray and labs are pending at this time.  Nausea somewhat improved following  IV morphine , but patient continues to report significant abdominal pain.  CT of his abdomen/pelvis is unremarkable, chest x-ray is also reassuring.  Troponin within normal limits and I doubt ACS, discomfort in his chest is likely due to frequent episodes of vomiting as he also reports burning in the back of his throat.  Leukocytosis noted at 17 along with elevated lactic acid, however no obvious source of infection at this time and we will hold off on antibiotics.  He has mild hypokalemia but no significant AKI or anemia.  LFTs are unremarkable.  Case discussed with hospitalist for admission.  FINAL CLINICAL IMPRESSION(S) / ED DIAGNOSES   Final diagnoses:  Opiate withdrawal (HCC)  Hematemesis with nausea     Rx / DC Orders   ED Discharge Orders     None        Note:  This document was prepared using Dragon voice recognition software and may include unintentional dictation errors.   Twilla Galea, MD 10/13/23 [phone]

## 2023-10-13 NOTE — ED Notes (Signed)
 Pt difficult stick, lab,and IV team attempt at collecting blood sample unsuccessful. MD made aware. Due fluids and medications administered per order. Attempt for blood will be resumed after hydration is completed.

## 2023-10-14 DIAGNOSIS — F1123 Opioid dependence with withdrawal: Secondary | ICD-10-CM | POA: Diagnosis present

## 2023-10-14 DIAGNOSIS — F1990 Other psychoactive substance use, unspecified, uncomplicated: Secondary | ICD-10-CM | POA: Diagnosis not present

## 2023-10-14 DIAGNOSIS — Z5329 Procedure and treatment not carried out because of patient's decision for other reasons: Secondary | ICD-10-CM | POA: Diagnosis not present

## 2023-10-14 DIAGNOSIS — Z8619 Personal history of other infectious and parasitic diseases: Secondary | ICD-10-CM | POA: Diagnosis not present

## 2023-10-14 DIAGNOSIS — E872 Acidosis, unspecified: Secondary | ICD-10-CM | POA: Diagnosis present

## 2023-10-14 DIAGNOSIS — F1193 Opioid use, unspecified with withdrawal: Secondary | ICD-10-CM | POA: Diagnosis not present

## 2023-10-14 DIAGNOSIS — E876 Hypokalemia: Secondary | ICD-10-CM | POA: Diagnosis present

## 2023-10-14 DIAGNOSIS — R9431 Abnormal electrocardiogram [ECG] [EKG]: Secondary | ICD-10-CM | POA: Diagnosis present

## 2023-10-14 DIAGNOSIS — Z79899 Other long term (current) drug therapy: Secondary | ICD-10-CM | POA: Diagnosis not present

## 2023-10-14 DIAGNOSIS — F17210 Nicotine dependence, cigarettes, uncomplicated: Secondary | ICD-10-CM | POA: Diagnosis present

## 2023-10-14 DIAGNOSIS — K920 Hematemesis: Secondary | ICD-10-CM | POA: Diagnosis present

## 2023-10-14 DIAGNOSIS — R251 Tremor, unspecified: Secondary | ICD-10-CM | POA: Diagnosis present

## 2023-10-14 DIAGNOSIS — M62838 Other muscle spasm: Secondary | ICD-10-CM | POA: Diagnosis present

## 2023-10-14 DIAGNOSIS — D72829 Elevated white blood cell count, unspecified: Secondary | ICD-10-CM | POA: Diagnosis present

## 2023-10-14 DIAGNOSIS — R112 Nausea with vomiting, unspecified: Secondary | ICD-10-CM | POA: Diagnosis present

## 2023-10-14 DIAGNOSIS — R0789 Other chest pain: Secondary | ICD-10-CM | POA: Diagnosis present

## 2023-10-14 DIAGNOSIS — K760 Fatty (change of) liver, not elsewhere classified: Secondary | ICD-10-CM | POA: Diagnosis present

## 2023-10-14 DIAGNOSIS — H5704 Mydriasis: Secondary | ICD-10-CM | POA: Diagnosis present

## 2023-10-14 DIAGNOSIS — Z8614 Personal history of Methicillin resistant Staphylococcus aureus infection: Secondary | ICD-10-CM | POA: Diagnosis not present

## 2023-10-14 DIAGNOSIS — F1910 Other psychoactive substance abuse, uncomplicated: Secondary | ICD-10-CM | POA: Diagnosis present

## 2023-10-14 LAB — TROPONIN I (HIGH SENSITIVITY)
Troponin I (High Sensitivity): 18 ng/L — ABNORMAL HIGH (ref <18)
Troponin I (High Sensitivity): 24 ng/L — ABNORMAL HIGH (ref <18)
Troponin I (High Sensitivity): 27 ng/L — ABNORMAL HIGH (ref <18)

## 2023-10-14 LAB — BASIC METABOLIC PANEL WITH GFR
Anion gap: 11 (ref 5–15)
BUN: 11 mg/dL (ref 6–20)
CO2: 22 mmol/L (ref 22–32)
Calcium: 8.9 mg/dL (ref 8.9–10.3)
Chloride: 109 mmol/L (ref 98–111)
Creatinine, Ser: 0.84 mg/dL (ref 0.61–1.24)
GFR, Estimated: >60 mL/min (ref >60)
Glucose, Bld: 101 mg/dL — ABNORMAL HIGH (ref 70–99)
Potassium: 3.7 mmol/L (ref 3.5–5.1)
Sodium: 142 mmol/L (ref 135–145)

## 2023-10-14 LAB — CBC
HCT: 38.7 % — ABNORMAL LOW (ref 39.0–52.0)
HCT: 45.9 % (ref 39.0–52.0)
Hemoglobin: 13.1 g/dL (ref 13.0–17.0)
Hemoglobin: 15.9 g/dL (ref 13.0–17.0)
MCH: 31.4 pg (ref 26.0–34.0)
MCH: 31.6 pg (ref 26.0–34.0)
MCHC: 33.9 g/dL (ref 30.0–36.0)
MCHC: 34.6 g/dL (ref 30.0–36.0)
MCV: 90.7 fL (ref 80.0–100.0)
MCV: 93.3 fL (ref 80.0–100.0)
Platelets: 359 10*3/uL (ref 150–400)
Platelets: 376 10*3/uL (ref 150–400)
RBC: 4.15 MIL/uL — ABNORMAL LOW (ref 4.22–5.81)
RBC: 5.06 MIL/uL (ref 4.22–5.81)
RDW: 12.9 % (ref 11.5–15.5)
RDW: 13 % (ref 11.5–15.5)
WBC: 13.2 10*3/uL — ABNORMAL HIGH (ref 4.0–10.5)
WBC: 17.2 10*3/uL — ABNORMAL HIGH (ref 4.0–10.5)
nRBC: 0 % (ref 0.0–0.2)
nRBC: 0 % (ref 0.0–0.2)

## 2023-10-14 LAB — LACTIC ACID, PLASMA
Lactic Acid, Venous: 1.2 mmol/L (ref 0.5–1.9)
Lactic Acid, Venous: 1.3 mmol/L (ref 0.5–1.9)
Lactic Acid, Venous: 1.9 mmol/L (ref 0.5–1.9)

## 2023-10-14 LAB — URINE DRUG SCREEN, QUALITATIVE (ARMC ONLY)
Amphetamines, Ur Screen: NOT DETECTED
Barbiturates, Ur Screen: NOT DETECTED
Benzodiazepine, Ur Scrn: POSITIVE — AB
Cannabinoid 50 Ng, Ur ~~LOC~~: NOT DETECTED
Cocaine Metabolite,Ur ~~LOC~~: NOT DETECTED
MDMA (Ecstasy)Ur Screen: NOT DETECTED
Methadone Scn, Ur: NOT DETECTED
Opiate, Ur Screen: POSITIVE — AB
Phencyclidine (PCP) Ur S: NOT DETECTED
Tricyclic, Ur Screen: POSITIVE — AB

## 2023-10-14 LAB — PHOSPHORUS: Phosphorus: 2.5 mg/dL (ref 2.5–4.6)

## 2023-10-14 LAB — PROTIME-INR
INR: 1.3 — ABNORMAL HIGH (ref 0.8–1.2)
Prothrombin Time: 16.3 s — ABNORMAL HIGH (ref 11.4–15.2)

## 2023-10-14 LAB — APTT: aPTT: 32 s (ref 24–36)

## 2023-10-14 LAB — PROCALCITONIN: Procalcitonin: <0.1 ng/mL

## 2023-10-14 LAB — HIV ANTIBODY (ROUTINE TESTING W REFLEX): HIV Screen 4th Generation wRfx: NONREACTIVE

## 2023-10-14 MED ORDER — METOCLOPRAMIDE HCL 5 MG/ML IJ SOLN
10.0000 mg | Freq: Once | INTRAMUSCULAR | Status: AC
  Administered 2023-10-14: 10 mg via INTRAVENOUS
  Filled 2023-10-14: qty 2

## 2023-10-14 MED ORDER — BUPRENORPHINE HCL-NALOXONE HCL 8-2 MG SL SUBL
1.0000 | SUBLINGUAL_TABLET | Freq: Every day | SUBLINGUAL | Status: DC
  Administered 2023-10-15: 1 via SUBLINGUAL
  Filled 2023-10-14: qty 1

## 2023-10-14 NOTE — Progress Notes (Signed)
  Progress Note   Patient: Samuel Weber NFA:[id number] DOB: [birth year] DOA: 10/13/2023     0 DOS: the patient was seen and examined on 10/14/2023    Assessment and Plan: Opioid withdrawal (HCC)  - IV NS 125 cc/hr  - Suboxone  8-2 mg 1 tab SL daily  - Clonidine  0.1 mg PO qid  - IV ativan  1 mg q6 hr  - Robaxin  PRN - Cardiac monitoring    IVDU (intravenous drug user) and polysubstance abuse (HCC) -Detailed counseling about importance of quitting substance use   Atypical chest pain - Cardiac monitoring  - IV Protonix  40 mg twice daily   Hypokalemia - Resolved   Elevated lactic acid level - Resolved    Leukocytosis - Blood cx's are negative thus far    Hematemesis - Hgb 13.1 today   Prolonged QT interval - K+ now wnl  - Avoid QT prolonging medications.   Tobacco abuse - Did counseling about importance of quitting tobacco use - Nicotine  patch  Subjective: Pt seen and examined at the bedside.He is still tremulous today. He remains in the hospital for IV ativan  and SL suboxone . Lactic acid has resolved.  Regular diet ordered for today.  Physical Exam: Vitals:   10/13/23 1937 10/13/23 2106 10/14/23 0031 10/14/23 0741  BP: (!) 156/73 135/83 (!) 140/78 (!) 142/91  Pulse: 75 68 73 89  Resp: 16 20  16   Temp: 98.2 F (36.8 C) 98.2 F (36.8 C)  98.3 F (36.8 C)  TempSrc: Axillary Oral    SpO2: 100% 100% 100% 100%  Weight:      Height:       Physical Exam HENT:     Head: Normocephalic.  Cardiovascular:     Rate and Rhythm: Normal rate and regular rhythm.  Pulmonary:     Effort: Pulmonary effort is normal.  Abdominal:     Palpations: Abdomen is soft.  Musculoskeletal:        General: Normal range of motion.     Comments: Tremors noted in both outstretched arms/hands   Skin:    General: Skin is warm.  Neurological:     Mental Status: He is alert and oriented to person, place, and time.  Psychiatric:        Mood and Affect: Mood normal.         Disposition: Status is: Inpatient Remains inpatient appropriate because: IV ativan  and tx of opioid withdrawal   Planned Discharge Destination: Home    Time spent: 35 minutes  Author: Jalessa Peyser , MD 10/14/2023 2:30 PM  For on call review [email or web address].

## 2023-10-14 NOTE — Discharge Instructions (Signed)

## 2023-10-14 NOTE — Progress Notes (Signed)
 Significant other in room, educated that bag can not be brought into patient's room. New order for tele-sitter, no tele-sitters available.... Will continue to make frequent observations in the room.  Valeria Boza V Janavia Rottman

## 2023-10-14 NOTE — TOC Initial Note (Signed)
 Transition of Care [facility]) - Initial/Assessment Note    Patient Details  Name: Samuel Weber MRN: [id number] Date of Birth: 06-11-1986  Transition of Care [facility]) CM/SW Contact:    Arminda Landmark, RN Phone Number: 10/14/2023, 5:00 PM  Clinical Narrative:                 Met with patient and his SO. He lives at home with his mother, she's the one that brought him in. He feels he doesn't need to go to drug rehab following this hospitalization, and states "I have made up my mind to not use anymore". He has a RX for suboxone , has a good support system,  and approves resources be added to the AVS which was done. TOC to continue to follow for further needs.   Expected Discharge Plan: Home/Self Care Barriers to Discharge: Continued Medical Work up   Patient Goals and CMS Choice Patient states their goals for this hospitalization and ongoing recovery are:: "I want to stop using drugs"          Expected Discharge Plan and Services   Discharge Planning Services: CM Consult, Other - See comment (SA resources)   Living arrangements for the past 2 months: Single Family Home                 DME Arranged:  (nkne needed at this time)                    Prior Living Arrangements/Services Living arrangements for the past 2 months: Single Family Home Lives with:: Domestic Partner Patient language and need for interpreter reviewed:: No Do you feel safe going back to the place where you live?: Yes      Need for Family Participation in Patient Care: Yes (Comment) Care giver support system in place?: Yes (comment) Current home services:  (none) Criminal Activity/Legal Involvement Pertinent to Current Situation/Hospitalization: No - Comment as needed  Activities of Daily Living   ADL Screening (condition at time of admission) Independently performs ADLs?: Yes (appropriate for developmental age) Is the patient deaf or have difficulty hearing?: No Does the patient have difficulty  seeing, even when wearing glasses/contacts?: No Does the patient have difficulty concentrating, remembering, or making decisions?: Yes  Permission Sought/Granted                  Emotional Assessment Appearance:: Appears stated age Attitude/Demeanor/Rapport: Gracious Affect (typically observed): Accepting, Appropriate, Pleasant Orientation: : Oriented to Self, Oriented to Place, Oriented to  Time, Oriented to Situation Alcohol / Substance Use: Illicit Drugs Psych Involvement: No (comment)  Admission diagnosis:  Opioid withdrawal (HCC) [F11.93] Opiate withdrawal (HCC) [F11.93] Hematemesis with nausea [K92.0] Patient Active Problem List   Diagnosis Date Noted   Opiate withdrawal (HCC) 10/14/2023   Polysubstance abuse (HCC) 10/13/2023   Atypical chest pain 10/13/2023   Hypokalemia 10/13/2023   Prolonged QT interval 10/13/2023   Elevated lactic acid level 10/13/2023   Leukocytosis 10/13/2023   Opioid withdrawal (HCC) 10/13/2023   Hematemesis 10/13/2023   IVDU (intravenous drug user)    Abscess of right hand 11/17/2021   Opiate abuse, continuous (HCC) 11/17/2021   Tobacco abuse 11/17/2021   Opioid dependence with withdrawal (HCC) 03/01/2014   PCP:  Pcp, No Pharmacy:   [id number] [place] [facility], [place] - [address] [place] [address] Phone: [phone] Fax: [phone]  [id number] - [place], [place] - [address] AT [facility]  OF [address] [place] [address] Phone: [phone] Fax: [phone]     Social Drivers of Health (SDOH) Social History: SDOH Screenings   Food Insecurity: No Food Insecurity (10/14/2023)  Housing: Low Risk  (10/14/2023)  Transportation Needs: No Transportation Needs (10/14/2023)  Utilities: Not At Risk (10/14/2023)  Tobacco Use: High Risk (10/13/2023)   SDOH Interventions:     Readmission Risk Interventions     No data to display

## 2023-10-15 DIAGNOSIS — F1910 Other psychoactive substance abuse, uncomplicated: Secondary | ICD-10-CM | POA: Diagnosis not present

## 2023-10-15 DIAGNOSIS — F1193 Opioid use, unspecified with withdrawal: Secondary | ICD-10-CM | POA: Diagnosis not present

## 2023-10-15 DIAGNOSIS — K920 Hematemesis: Secondary | ICD-10-CM

## 2023-10-15 DIAGNOSIS — R9431 Abnormal electrocardiogram [ECG] [EKG]: Secondary | ICD-10-CM

## 2023-10-15 DIAGNOSIS — D72829 Elevated white blood cell count, unspecified: Secondary | ICD-10-CM | POA: Diagnosis not present

## 2023-10-15 LAB — CBC
HCT: 38.1 % — ABNORMAL LOW (ref 39.0–52.0)
Hemoglobin: 12.9 g/dL — ABNORMAL LOW (ref 13.0–17.0)
MCH: 31.7 pg (ref 26.0–34.0)
MCHC: 33.9 g/dL (ref 30.0–36.0)
MCV: 93.6 fL (ref 80.0–100.0)
Platelets: 290 10*3/uL (ref 150–400)
RBC: 4.07 MIL/uL — ABNORMAL LOW (ref 4.22–5.81)
RDW: 13.2 % (ref 11.5–15.5)
WBC: 9.2 10*3/uL (ref 4.0–10.5)
nRBC: 0 % (ref 0.0–0.2)

## 2023-10-15 LAB — COMPREHENSIVE METABOLIC PANEL WITH GFR
ALT: 16 U/L (ref 0–44)
AST: 21 U/L (ref 15–41)
Albumin: 3.2 g/dL — ABNORMAL LOW (ref 3.5–5.0)
Alkaline Phosphatase: 56 U/L (ref 38–126)
Anion gap: 5 (ref 5–15)
BUN: 13 mg/dL (ref 6–20)
CO2: 22 mmol/L (ref 22–32)
Calcium: 8.8 mg/dL — ABNORMAL LOW (ref 8.9–10.3)
Chloride: 110 mmol/L (ref 98–111)
Creatinine, Ser: 0.78 mg/dL (ref 0.61–1.24)
GFR, Estimated: >60 mL/min (ref >60)
Glucose, Bld: 94 mg/dL (ref 70–99)
Potassium: 3.5 mmol/L (ref 3.5–5.1)
Sodium: 137 mmol/L (ref 135–145)
Total Bilirubin: 1.1 mg/dL (ref 0.0–1.2)
Total Protein: 6.4 g/dL — ABNORMAL LOW (ref 6.5–8.1)

## 2023-10-15 LAB — MAGNESIUM: Magnesium: 2.1 mg/dL (ref 1.7–2.4)

## 2023-10-15 LAB — PHOSPHORUS: Phosphorus: 2.5 mg/dL (ref 2.5–4.6)

## 2023-10-15 MED ORDER — LACTATED RINGERS IV SOLN: INTRAVENOUS | Status: AC

## 2023-10-15 MED ORDER — ENOXAPARIN SODIUM 40 MG/0.4ML IJ SOSY
40.0000 mg | PREFILLED_SYRINGE | INTRAMUSCULAR | Status: DC
  Administered 2023-10-15: 40 mg via SUBCUTANEOUS
  Filled 2023-10-15: qty 0.4

## 2023-10-15 NOTE — Progress Notes (Signed)
 PROGRESS NOTE    Samuel Weber  VWU:[id number] DOB: 14-Feb-1986 DOA: 10/13/2023 PCP: Pcp, No  Chief Complaint  Patient presents with   fentanyl  withdrawel   Hematemesis   Chest Pain    Hospital Course:  Samuel Weber is 38 y.o. male with polysubstance abuse, IV drug abuse, HCV, MRSA, prior overdose, tobacco abuse, who presents in opioid withdrawal.  Patient endorses hematemesis, abdominal pain, denies diarrhea.  On arrival patient had diaphoresis and dilated pupil.  Labs in the ED revealed WBC 17.9, hemoglobin 15.9, potassium 3.2, lactic acid 5.2, heart rate 54, respiratory rate 24.  Chest x-ray was unremarkable.  CT abdomen pelvis negative for acute intra-abdominal process.  He was admitted on telemetry for observation.  Subjective: Patient found sleeping in the fetal position today.  He reports he is in significant pain all over, nausea, feels cold.  Reports the Suboxone  is put him back into withdrawals.  Does not feel like talking   Objective: Vitals:   10/14/23 2202 10/14/23 2300 10/14/23 2331 10/15/23 0846  BP: 130/73 109/60  (!) 153/92  Pulse: 75 (!) 52 83 92  Resp: 16 16    Temp: 98.7 F (37.1 C)   98.7 F (37.1 C)  TempSrc: Oral     SpO2: 100%   99%  Weight:      Height:       No intake or output data in the 24 hours ending 10/15/23 0849 Filed Weights   10/13/23 1434  Weight: 68 kg    Examination: General exam: Lying in the fetal position, sleeping.  Appears uncomfortable Respiratory system: No work of breathing, symmetric chest wall expansion Cardiovascular system: S1 & S2 heard, RRR.  Gastrointestinal system: Abdomen is nondistended, tender to palpation diffusely Neuro: drowsy but arousable. No focal neurological deficits.  Assessment & Plan:  Principal Problem:   Opioid withdrawal (HCC) Active Problems:   IVDU (intravenous drug user)   Polysubstance abuse (HCC)   Atypical chest pain   Hypokalemia   Elevated lactic acid level   Leukocytosis    Hematemesis   Prolonged QT interval   Tobacco abuse   Opiate withdrawal (HCC)   Opioid withdrawal - Admitted with diaphoresis, nausea, vomiting, abdominal pain, dilated pupils, QTc prolongation, hypokalemia - Continue on telemetry - Currently on Suboxone  and clonidine . - Continue with IV Ativan  1 mg every 6, taper as tolerated - As needed Robaxin  - Patient received 4 mg morphine  in the ED - Continue maintenance IV fluids  QTc prolongation - QTc on arrival 536, likely secondary to hypokalemia - Has resolved on repeat - Avoid QT prolonging meds  Hypokalemia - Replace as needed, currently stable. - Continue to trend CMP  Tobacco abuse - Continue nicotine  patch  IV drug abuse Polysubstance abuse - Has been counseled extensively on cessation - UDS: Positive for tricyclics, opioids, benzos  Atypical chest pain - Initial troponin 11, 18 on repeat - Continue cardiac monitoring on telemetry  Hematemesis - May be secondary to Mallory-Weiss tear.  Stable now - Continue with Protonix  - Trend hemoglobin,, currently stable - Maintain IV access with 2 large-bore IVs  Leukocytosis Lactic acidosis - Lactic acid on arrival 5.2, WBC 17.9 - Patient was afebrile, sepsis ruled out.  Findings thought to be secondary to dehydration - Maintenance IV fluids as above - No clear source of infection identified.  Patient does have multiple track marks on both arms but no obvious skin infection.  He is high risk of bacteremia -- Leukocytosis and lactic acidosis  resolved without intervention. - Procalcitonin: Less than 0.10 - Follow blood cultures, negative to date  Fatty infiltration of liver - Incidentally seen on CT.  No new liver lesion. - Follow-up with PCP for surveillance  DVT prophylaxis: Lovenox    Code Status: Full Code Disposition:  Inpatient still hospitalized for active withdraw.     Procedures:    Antimicrobials:  Anti-infectives (From admission, onward)    None        Data Reviewed: I have personally reviewed following labs and imaging studies CBC: Recent Labs  Lab 10/13/23 1453 10/13/23 1946 10/13/23 2339 10/14/23 0216 10/15/23 0523  WBC 17.9* 16.5* 13.2* 17.2* 9.2  HGB 15.9 15.0 15.9 13.1 12.9*  HCT 47.1 45.1 45.9 38.7* 38.1*  MCV 94.6 95.1 90.7 93.3 93.6  PLT 515* 373 359 376 290   Basic Metabolic Panel: Recent Labs  Lab 10/13/23 1453 10/13/23 2339 10/14/23 0216 10/15/23 0523  NA 141  --  142 137  K 3.2*  --  3.7 3.5  CL 104  --  109 110  CO2 21*  --  22 22  GLUCOSE 187*  --  101* 94  BUN 15  --  11 13  CREATININE 0.99  --  0.84 0.78  CALCIUM 10.0  --  8.9 8.8*  MG 2.2  --   --  2.1  PHOS  --  2.5  --  2.5   GFR: Estimated Creatinine Clearance: 122.8 mL/min (by C-G formula based on SCr of 0.78 mg/dL). Liver Function Tests: Recent Labs  Lab 10/13/23 1453 10/15/23 0523  AST 30 21  ALT 20 16  ALKPHOS 79 56  BILITOT 1.0 1.1  PROT 9.1* 6.4*  ALBUMIN 4.9 3.2*   CBG: No results for input(s): "GLUCAP" in the last 168 hours.  Recent Results (from the past 240 hours)  Culture, blood (routine x 2)     Status: None (Preliminary result)   Collection Time: 10/13/23  7:46 PM   Specimen: BLOOD RIGHT ARM  Result Value Ref Range Status   Specimen Description BLOOD RIGHT ARM  Final   Special Requests   Final    BOTTLES DRAWN AEROBIC AND ANAEROBIC Blood Culture results may not be optimal due to an inadequate volume of blood received in culture bottles   Culture   Final    NO GROWTH 2 DAYS Performed at [facility], [address] [place], [place] [address]    Report Status PENDING  Incomplete  Culture, blood (Routine X 2) w Reflex to ID Panel     Status: None (Preliminary result)   Collection Time: 10/14/23  6:31 AM   Specimen: BLOOD  Result Value Ref Range Status   Specimen Description BLOOD R FOOT  Final   Special Requests IN PEDIATRIC BOTTLE  Final   Culture   Final    NO GROWTH < 24 HOURS Performed at  [facility], [address], [place], [place] [address]    Report Status PENDING  Incomplete     Radiology Studies: CT ABDOMEN PELVIS W CONTRAST Result Date: 10/13/2023 CLINICAL DATA:  Acute abdominal pain EXAM: CT ABDOMEN AND PELVIS WITH CONTRAST TECHNIQUE: Multidetector CT imaging of the abdomen and pelvis was performed using the standard protocol following bolus administration of intravenous contrast. RADIATION DOSE REDUCTION: This exam was performed according to the departmental dose-optimization program which includes automated exposure control, adjustment of the mA and/or kV according to patient size and/or use of iterative reconstruction technique. CONTRAST:  75mL OMNIPAQUE  IOHEXOL   300 MG/ML  SOLN COMPARISON:  09/13/2007 FINDINGS: Lower chest: No pleural or pericardial effusion. Visualized lung bases clear. Hepatobiliary: Stable focal fatty infiltration near the falciform ligament. No new liver lesion or biliary ductal dilatation. Gallbladder physiologically distended without calcified stones. Pancreas: Unremarkable. No pancreatic ductal dilatation or surrounding inflammatory changes. Spleen: Normal in size without focal abnormality. Adrenals/Urinary Tract: Adrenal glands are unremarkable. Kidneys are normal, without renal calculi, worrisome lesion, or hydronephrosis. Bladder is unremarkable. Stomach/Bowel: Stomach is partially distended, without acute finding. Small bowel nondilated. Post appendectomy with clip near the base of the cecum. The colon is partially distended, without acute finding. Vascular/Lymphatic: No significant vascular findings are present. No enlarged abdominal or pelvic lymph nodes. Reproductive: Prostate is unremarkable. Other: Left pelvic phleboliths.  No ascites.  No free air. Musculoskeletal: No acute or significant osseous findings. IMPRESSION: No acute findings. Electronically Signed   By: Nicoletta Barrier M.D.   On: 10/13/2023 17:20   DG Chest Port 1 View Result  Date: 10/13/2023 CLINICAL DATA:  [id number] Chest pain [id number] EXAM: PORTABLE CHEST - 1 VIEW COMPARISON:  07/09/2014. FINDINGS: Cardiac silhouette is unremarkable. No pneumothorax or pleural effusion. The lungs are clear. The visualized skeletal structures are unremarkable. IMPRESSION: No acute cardiopulmonary process. Electronically Signed   By: Sydell Eva M.D.   On: 10/13/2023 16:26    Scheduled Meds:  buprenorphine -naloxone   1 tablet Sublingual Daily   cloNIDine   0.1 mg Oral QID   Followed by   Cecily Cohen ON 10/16/2023] cloNIDine   0.1 mg Oral BH-qamhs   Followed by   Cecily Cohen ON 10/18/2023] cloNIDine   0.1 mg Oral QAC breakfast   gabapentin   600 mg Oral QHS   LORazepam   1 mg Intravenous Q6H   nicotine   21 mg Transdermal Daily   pantoprazole  (PROTONIX ) IV  40 mg Intravenous Q12H   Continuous Infusions:   LOS: 1 day  MDM: Patient is high risk for one or more organ failure.  They necessitate ongoing hospitalization for continued IV therapies and subsequent lab monitoring. Total time spent interpreting labs and vitals, reviewing the medical record, coordinating care amongst consultants and care team members, directly assessing and discussing care with the patient and/or family: 55 min Inesha Sow, DO [facility]  To contact the attending physician between 7A-7P please use Epic Chat. To contact the covering physician during after hours 7P-7A, please review Amion.  10/15/2023, 8:49 AM   *This document has been created with the assistance of dictation software. Please excuse typographical errors. *

## 2023-10-15 NOTE — Plan of Care (Signed)

## 2023-10-16 DIAGNOSIS — F1990 Other psychoactive substance use, unspecified, uncomplicated: Secondary | ICD-10-CM | POA: Diagnosis not present

## 2023-10-16 DIAGNOSIS — R0789 Other chest pain: Secondary | ICD-10-CM | POA: Diagnosis not present

## 2023-10-16 DIAGNOSIS — E876 Hypokalemia: Secondary | ICD-10-CM | POA: Diagnosis not present

## 2023-10-16 DIAGNOSIS — F1193 Opioid use, unspecified with withdrawal: Secondary | ICD-10-CM | POA: Diagnosis not present

## 2023-10-16 MED ORDER — LORAZEPAM 2 MG/ML IJ SOLN
0.5000 mg | Freq: Four times a day (QID) | INTRAMUSCULAR | Status: DC
  Administered 2023-10-16 (×2): 0.5 mg via INTRAVENOUS
  Filled 2023-10-16 (×2): qty 1

## 2023-10-16 MED ORDER — ENSURE ENLIVE PO LIQD
237.0000 mL | Freq: Two times a day (BID) | ORAL | Status: DC
  Administered 2023-10-16: 237 mL via ORAL

## 2023-10-16 MED ORDER — PANTOPRAZOLE SODIUM 40 MG PO TBEC: 40.0000 mg | DELAYED_RELEASE_TABLET | Freq: Two times a day (BID) | ORAL | Status: DC

## 2023-10-16 MED ORDER — SODIUM CHLORIDE 0.9 % IV SOLN
12.5000 mg | Freq: Four times a day (QID) | INTRAVENOUS | Status: DC | PRN
  Administered 2023-10-16: 12.5 mg via INTRAVENOUS
  Filled 2023-10-16: qty 12.5

## 2023-10-16 NOTE — Progress Notes (Signed)
 Patient states he wants another room, offers were suggested to him, states he wants to go to another hospital, charge nurse states not clinically indicated.  Patient states "I have not got any sleep for 48hrs".  Writer reminded patient I was his nurse for the past 2 nights, and was "sleeping so good I was notified by ccmd that your heart was beating slow.  Reminded him several times I came into the room to check on him while he slept through the night.  Patient's male visitor stated " he want to leave".  Charge nurse in to listen and discuss patient options.  Patient state's "I'm leaving".  Telemetry monitor and IV removed, NP messenged, patient and male visitor, left unit, patient alert, ambulatory with steady gait, appears stable.

## 2023-10-16 NOTE — Assessment & Plan Note (Addendum)
 HIV test nonreactive.  Patient signed out AGAINST MEDICAL ADVICE before hepatitis C and B profile sent off.

## 2023-10-16 NOTE — Assessment & Plan Note (Signed)
 Replaced

## 2023-10-16 NOTE — Assessment & Plan Note (Signed)
 Improved

## 2023-10-16 NOTE — Assessment & Plan Note (Signed)
-   Telemetry monitoring 

## 2023-10-16 NOTE — Plan of Care (Signed)

## 2023-10-16 NOTE — Assessment & Plan Note (Addendum)
 Patient felt worse after Suboxone . Patient on IV fluids, clonidine , Ativan , gabapentin .  Supportive care.

## 2023-10-16 NOTE — Progress Notes (Addendum)
  Progress Note   Patient: Samuel Weber ZOX:[id number] DOB: 1986/05/12 DOA: 10/13/2023     2 DOS: the patient was seen and examined on 10/16/2023   Brief hospital course: 38 y.o. male with polysubstance abuse, IV drug abuse, HCV, MRSA, prior overdose, tobacco abuse, who presents in opioid withdrawal.  Patient endorses hematemesis, abdominal pain, denies diarrhea.  On arrival patient had diaphoresis and dilated pupil.  Labs in the ED revealed WBC 17.9, hemoglobin 15.9, potassium 3.2, lactic acid 5.2, heart rate 54, respiratory rate 24.  Chest x-ray was unremarkable.  CT abdomen pelvis negative for acute intra-abdominal process.   4/23.  Patient felt miserable after Suboxone  dose yesterday.  Does not want to take this medication anymore.  Feels nauseous.  Blood cultures negative for 2 and 3 days.  Assessment and Plan: * Opioid withdrawal (HCC) Patient felt worse after Suboxone . Patient on IV fluids, clonidine , Ativan , gabapentin .  Supportive care.  Polysubstance abuse [facility]) Patient must stop all substances  IVDU (intravenous drug user) HIV test nonreactive.  Check hepatitis C and B profiles.  Atypical chest pain Troponins negative  Hypokalemia Replaced  Elevated lactic acid level Improved  Leukocytosis Likely reactive  Hematemesis Hemoglobin stable on Protonix   Prolonged QT interval Telemetry monitoring        Subjective: Patient states he cannot get any rest here.  Felt worse after taking Suboxone  yesterday and does not want this medication anymore.  Feels weak.  Feels nauseous.  Physical Exam: Vitals:   10/15/23 1615 10/15/23 2030 10/16/23 0417 10/16/23 0832  BP: 111/62 136/89 (!) 153/94 (!) 145/83  Pulse: 78 (!) 57 78 (!) 53  Resp: 18  20 16   Temp: 97.8 F (36.6 C) 98.4 F (36.9 C) 98.2 F (36.8 C) 98.2 F (36.8 C)  TempSrc: Oral Oral Oral   SpO2: 99% 97% 98% 97%  Weight:      Height:       Physical Exam HENT:     Head: Normocephalic.      Mouth/Throat:     Pharynx: No oropharyngeal exudate.  Eyes:     General: Lids are normal.     Conjunctiva/sclera: Conjunctivae normal.  Cardiovascular:     Rate and Rhythm: Normal rate and regular rhythm.     Heart sounds: Normal heart sounds, S1 normal and S2 normal.  Pulmonary:     Breath sounds: No decreased breath sounds, wheezing, rhonchi or rales.  Abdominal:     Palpations: Abdomen is soft.     Tenderness: There is no abdominal tenderness.  Musculoskeletal:     Right lower leg: No swelling.     Left lower leg: No swelling.  Skin:    General: Skin is warm.     Comments: Track marks seen on arms  Neurological:     Mental Status: He is alert and oriented to person, place, and time.     Data Reviewed: 1 blood culture negative for 2 days 1 blood culture negative for 3 days, HIV test negative, creatinine 0.78, electrolytes normal range, white blood count 12.9  Disposition: Status is: Inpatient Remains inpatient appropriate because: Reevaluate on a daily basis  Planned Discharge Destination: Home    Time spent: 38 minutes  Author: Verla Glaze, MD 10/16/2023 2:00 PM  For on call review [email or web address].

## 2023-10-16 NOTE — Assessment & Plan Note (Signed)
 Troponins negative

## 2023-10-16 NOTE — Assessment & Plan Note (Signed)
Likely reactive 

## 2023-10-16 NOTE — Hospital Course (Signed)
 38 y.o. male with polysubstance abuse, IV drug abuse, HCV, MRSA, prior overdose, tobacco abuse, who presents in opioid withdrawal.  Patient endorses hematemesis, abdominal pain, denies diarrhea.  On arrival patient had diaphoresis and dilated pupil.  Labs in the ED revealed WBC 17.9, hemoglobin 15.9, potassium 3.2, lactic acid 5.2, heart rate 54, respiratory rate 24.  Chest x-ray was unremarkable.  CT abdomen pelvis negative for acute intra-abdominal process.   4/23.  Patient felt miserable after Suboxone  dose yesterday.  Does not want to take this medication anymore.  Feels nauseous.  Blood cultures negative for 2 and 3 days.  Patient signed out AGAINST MEDICAL ADVICE after my shift was over 4/23 in the evening.

## 2023-10-16 NOTE — Assessment & Plan Note (Signed)
 Hemoglobin stable on Protonix 

## 2023-10-16 NOTE — Assessment & Plan Note (Signed)
 Patient must stop all substances

## 2023-10-17 ENCOUNTER — Emergency Department
Admission: EM | Admit: 2023-10-17 | Discharge: 2023-10-17 | Disposition: A | Discharge status: Home / Self Care | Attending: Emergency Medicine | Admitting: Emergency Medicine

## 2023-10-17 ENCOUNTER — Other Ambulatory Visit: Payer: Self-pay

## 2023-10-17 ENCOUNTER — Emergency Department

## 2023-10-17 DIAGNOSIS — R112 Nausea with vomiting, unspecified: Secondary | ICD-10-CM

## 2023-10-17 DIAGNOSIS — R079 Chest pain, unspecified: Secondary | ICD-10-CM

## 2023-10-17 DIAGNOSIS — R0789 Other chest pain: Secondary | ICD-10-CM | POA: Insufficient documentation

## 2023-10-17 DIAGNOSIS — E876 Hypokalemia: Secondary | ICD-10-CM | POA: Insufficient documentation

## 2023-10-17 LAB — COMPREHENSIVE METABOLIC PANEL WITH GFR
ALT: 21 U/L (ref 0–44)
AST: 25 U/L (ref 15–41)
Albumin: 3.6 g/dL (ref 3.5–5.0)
Alkaline Phosphatase: 53 U/L (ref 38–126)
Anion gap: 9 (ref 5–15)
BUN: 17 mg/dL (ref 6–20)
CO2: 23 mmol/L (ref 22–32)
Calcium: 8.9 mg/dL (ref 8.9–10.3)
Chloride: 111 mmol/L (ref 98–111)
Creatinine, Ser: 0.93 mg/dL (ref 0.61–1.24)
GFR, Estimated: >60 mL/min (ref >60)
Glucose, Bld: 88 mg/dL (ref 70–99)
Potassium: 3.2 mmol/L — ABNORMAL LOW (ref 3.5–5.1)
Sodium: 143 mmol/L (ref 135–145)
Total Bilirubin: 1.4 mg/dL — ABNORMAL HIGH (ref 0.0–1.2)
Total Protein: 6.9 g/dL (ref 6.5–8.1)

## 2023-10-17 LAB — URINALYSIS, ROUTINE W REFLEX MICROSCOPIC
Bacteria, UA: NONE SEEN
Bilirubin Urine: NEGATIVE
Glucose, UA: NEGATIVE mg/dL
Hgb urine dipstick: NEGATIVE
Ketones, ur: 5 mg/dL — AB
Leukocytes,Ua: NEGATIVE
Nitrite: NEGATIVE
Protein, ur: 30 mg/dL — AB
Specific Gravity, Urine: 1.027 (ref 1.005–1.030)
Squamous Epithelial / HPF: 0 /HPF (ref 0–5)
pH: 7 (ref 5.0–8.0)

## 2023-10-17 LAB — CBC WITH DIFFERENTIAL/PLATELET
Abs Immature Granulocytes: 0.02 10*3/uL (ref 0.00–0.07)
Basophils Absolute: 0 10*3/uL (ref 0.0–0.1)
Basophils Relative: 1 %
Eosinophils Absolute: 0.1 10*3/uL (ref 0.0–0.5)
Eosinophils Relative: 1 %
HCT: 39.5 % (ref 39.0–52.0)
Hemoglobin: 13.4 g/dL (ref 13.0–17.0)
Immature Granulocytes: 0 %
Lymphocytes Relative: 30 %
Lymphs Abs: 2.6 10*3/uL (ref 0.7–4.0)
MCH: 31.8 pg (ref 26.0–34.0)
MCHC: 33.9 g/dL (ref 30.0–36.0)
MCV: 93.8 fL (ref 80.0–100.0)
Monocytes Absolute: 0.7 10*3/uL (ref 0.1–1.0)
Monocytes Relative: 8 %
Neutro Abs: 5.2 10*3/uL (ref 1.7–7.7)
Neutrophils Relative %: 60 %
Platelets: 216 10*3/uL (ref 150–400)
RBC: 4.21 MIL/uL — ABNORMAL LOW (ref 4.22–5.81)
RDW: 12.8 % (ref 11.5–15.5)
WBC: 8.6 10*3/uL (ref 4.0–10.5)
nRBC: 0 % (ref 0.0–0.2)

## 2023-10-17 LAB — MAGNESIUM: Magnesium: 2.1 mg/dL (ref 1.7–2.4)

## 2023-10-17 LAB — URINE DRUG SCREEN, QUALITATIVE (ARMC ONLY)
Amphetamines, Ur Screen: NOT DETECTED
Barbiturates, Ur Screen: NOT DETECTED
Benzodiazepine, Ur Scrn: POSITIVE — AB
Cannabinoid 50 Ng, Ur ~~LOC~~: NOT DETECTED
Cocaine Metabolite,Ur ~~LOC~~: NOT DETECTED
MDMA (Ecstasy)Ur Screen: NOT DETECTED
Methadone Scn, Ur: NOT DETECTED
Opiate, Ur Screen: NOT DETECTED
Phencyclidine (PCP) Ur S: NOT DETECTED
Tricyclic, Ur Screen: NOT DETECTED

## 2023-10-17 LAB — TROPONIN I (HIGH SENSITIVITY)
Troponin I (High Sensitivity): 4 ng/L (ref <18)
Troponin I (High Sensitivity): 4 ng/L (ref <18)

## 2023-10-17 LAB — LIPASE, BLOOD: Lipase: 59 U/L — ABNORMAL HIGH (ref 11–51)

## 2023-10-17 LAB — ETHANOL: Alcohol, Ethyl (B): <15 mg/dL (ref <15)

## 2023-10-17 MED ORDER — ONDANSETRON 4 MG PO TBDP: 4.0000 mg | ORAL_TABLET | Freq: Four times a day (QID) | ORAL | 0 refills | Status: DC | PRN

## 2023-10-17 MED ORDER — ONDANSETRON HCL 4 MG/2ML IJ SOLN
4.0000 mg | Freq: Once | INTRAMUSCULAR | Status: AC
  Administered 2023-10-17: 4 mg via INTRAVENOUS
  Filled 2023-10-17: qty 2

## 2023-10-17 MED ORDER — PROMETHAZINE HCL 12.5 MG PO TABS: 12.5000 mg | ORAL_TABLET | Freq: Four times a day (QID) | ORAL | 0 refills | Status: DC | PRN

## 2023-10-17 MED ORDER — SODIUM CHLORIDE 0.9 % IV SOLN
12.5000 mg | Freq: Once | INTRAVENOUS | Status: AC
  Administered 2023-10-17: 12.5 mg via INTRAVENOUS
  Filled 2023-10-17: qty 12.5

## 2023-10-17 MED ORDER — SODIUM CHLORIDE 0.9 % IV BOLUS (SEPSIS)
1000.0000 mL | Freq: Once | INTRAVENOUS | Status: AC
  Administered 2023-10-17: 1000 mL via INTRAVENOUS

## 2023-10-17 MED ORDER — PANTOPRAZOLE SODIUM 40 MG IV SOLR
40.0000 mg | Freq: Once | INTRAVENOUS | Status: AC
  Administered 2023-10-17: 40 mg via INTRAVENOUS
  Filled 2023-10-17: qty 10

## 2023-10-17 MED ORDER — KETOROLAC TROMETHAMINE 30 MG/ML IJ SOLN
30.0000 mg | Freq: Once | INTRAMUSCULAR | Status: AC
  Administered 2023-10-17: 30 mg via INTRAVENOUS
  Filled 2023-10-17: qty 1

## 2023-10-17 MED ORDER — POTASSIUM CHLORIDE CRYS ER 20 MEQ PO TBCR
40.0000 meq | EXTENDED_RELEASE_TABLET | Freq: Once | ORAL | Status: AC
  Administered 2023-10-17: 40 meq via ORAL
  Filled 2023-10-17: qty 2

## 2023-10-17 NOTE — ED Provider Notes (Signed)
-----------------------------------------   7:10 AM on 10/17/2023 -----------------------------------------  Blood pressure (!) 148/99, pulse 80, temperature 98.1 F (36.7 C), temperature source Oral, resp. rate 16, height 6\' 1"  (1.854 m), weight 68 kg, SpO2 97%.  Assuming care from Dr. Author Board.  In short, Samuel Weber is a 38 y.o. male with a chief complaint of Chest Pain .  Refer to the original H&P for additional details.  The current plan of care is to follow-up repeat troponin, plan for discharge home if stable.  ----------------------------------------- 9:33 AM on 10/17/2023 ----------------------------------------- Repeat troponin within normal limits, nausea and vomiting improved following IV Phenergan  and patient is tolerating oral intake without difficulty.  Given reassuring workup, patient appropriate for discharge home with outpatient follow-up for substance abuse, prescription provided for Phenergan .  He was counseled to return to the ED for new or worsening symptoms, patient agrees with plan.       Twilla Galea, MD 10/17/23 [phone]

## 2023-10-17 NOTE — ED Notes (Signed)
 This tech attempted to find a vein for venipuncture. Pt is a very hard stick. Lab was called and stated they would send someone.

## 2023-10-17 NOTE — ED Triage Notes (Signed)
 Pt presents via POV c/o hematemesis and chest pain. Reports recently admitted for heroine withdraw but left AMA. Reports "supposed to have EKG this morning at 5am" however explained to pt since left AMA, he will need to be seen as an ED patient.

## 2023-10-17 NOTE — Discharge Summary (Signed)
 Physician Discharge Summary   Patient: ARIEL WINGROVE MRN: [id number] DOB: [birth year]  Admit date:     10/13/2023  date of signing out AGAINST MEDICAL ADVICE: 10/16/2023  Discharge Physician: Verla Glaze   PCP: Pcp, No   Recommendations at discharge:   None, since patient left AGAINST MEDICAL ADVICE after my shift was over.  Discharge Diagnoses: Principal Problem:   Opioid withdrawal (HCC) Active Problems:   IVDU (intravenous drug user)   Polysubstance abuse (HCC)   Atypical chest pain   Hypokalemia   Elevated lactic acid level   Leukocytosis   Hematemesis   Prolonged QT interval   Tobacco abuse   Opiate withdrawal [facility])    Hospital Course: 38 y.o. male with polysubstance abuse, IV drug abuse, HCV, MRSA, prior overdose, tobacco abuse, who presents in opioid withdrawal.  Patient endorses hematemesis, abdominal pain, denies diarrhea.  On arrival patient had diaphoresis and dilated pupil.  Labs in the ED revealed WBC 17.9, hemoglobin 15.9, potassium 3.2, lactic acid 5.2, heart rate 54, respiratory rate 24.  Chest x-ray was unremarkable.  CT abdomen pelvis negative for acute intra-abdominal process.   4/23.  Patient felt miserable after Suboxone  dose yesterday.  Does not want to take this medication anymore.  Feels nauseous.  Blood cultures negative for 2 and 3 days.  Patient signed out AGAINST MEDICAL ADVICE after my shift was over 4/23 in the evening.  Assessment and Plan: * Opioid withdrawal (HCC) Patient felt worse after Suboxone , so this medication was stopped. Patient on IV fluids, clonidine , Ativan , gabapentin .  Supportive care.  Since the patient signed out AGAINST MEDICAL ADVICE after my shift was over I was unable to prescribe any medications for him.  Polysubstance abuse [facility]) Patient must stop all substances  IVDU (intravenous drug user) HIV test nonreactive.  Patient signed out AGAINST MEDICAL ADVICE before hepatitis C and B profile sent off.  Atypical  chest pain Troponins negative  Hypokalemia Replaced  Elevated lactic acid level Improved  Leukocytosis Likely reactive  Hematemesis Hemoglobin stable on Protonix   Prolonged QT interval Telemetry monitoring         Consultants: None Procedures performed: None Disposition: Home Diet recommendation:  Regular diet DISCHARGE MEDICATION: Allergies as of 10/16/2023   No Known Allergies      Medication List     STOP taking these medications    acetaminophen  500 MG tablet Commonly known as: TYLENOL    gabapentin  600 MG tablet Commonly known as: NEURONTIN    ibuprofen  600 MG tablet Commonly known as: ADVIL    methocarbamol  500 MG tablet Commonly known as: ROBAXIN    Suboxone  8-2 MG Film Generic drug: Buprenorphine  HCl-Naloxone  HCl   traZODone 50 MG tablet Commonly known as: DESYREL        Discharge Exam: Filed Weights   10/13/23 1434  Weight: 68 kg     Condition at discharge: fair  The results of significant diagnostics from this hospitalization (including imaging, microbiology, ancillary and laboratory) are listed below for reference.   Imaging Studies: DG Chest 2 View Result Date: 10/17/2023 CLINICAL DATA:  Hematemesis and chest pain EXAM: CHEST - 2 VIEW COMPARISON:  Four days prior FINDINGS: Normal heart size and mediastinal contours. No acute infiltrate or edema. No effusion or pneumothorax. No acute osseous findings. IMPRESSION: No active cardiopulmonary disease. Electronically Signed   By: Ronnette Coke M.D.   On: 10/17/2023 05:36   CT ABDOMEN PELVIS W CONTRAST Result Date: 10/13/2023 CLINICAL DATA:  Acute abdominal pain EXAM: CT ABDOMEN AND PELVIS WITH  CONTRAST TECHNIQUE: Multidetector CT imaging of the abdomen and pelvis was performed using the standard protocol following bolus administration of intravenous contrast. RADIATION DOSE REDUCTION: This exam was performed according to the departmental dose-optimization program which includes  automated exposure control, adjustment of the mA and/or kV according to patient size and/or use of iterative reconstruction technique. CONTRAST:  75mL OMNIPAQUE  IOHEXOL  300 MG/ML  SOLN COMPARISON:  09/13/2007 FINDINGS: Lower chest: No pleural or pericardial effusion. Visualized lung bases clear. Hepatobiliary: Stable focal fatty infiltration near the falciform ligament. No new liver lesion or biliary ductal dilatation. Gallbladder physiologically distended without calcified stones. Pancreas: Unremarkable. No pancreatic ductal dilatation or surrounding inflammatory changes. Spleen: Normal in size without focal abnormality. Adrenals/Urinary Tract: Adrenal glands are unremarkable. Kidneys are normal, without renal calculi, worrisome lesion, or hydronephrosis. Bladder is unremarkable. Stomach/Bowel: Stomach is partially distended, without acute finding. Small bowel nondilated. Post appendectomy with clip near the base of the cecum. The colon is partially distended, without acute finding. Vascular/Lymphatic: No significant vascular findings are present. No enlarged abdominal or pelvic lymph nodes. Reproductive: Prostate is unremarkable. Other: Left pelvic phleboliths.  No ascites.  No free air. Musculoskeletal: No acute or significant osseous findings. IMPRESSION: No acute findings. Electronically Signed   By: Nicoletta Barrier M.D.   On: 10/13/2023 17:20   DG Chest Port 1 View Result Date: 10/13/2023 CLINICAL DATA:  [id number] Chest pain [id number] EXAM: PORTABLE CHEST - 1 VIEW COMPARISON:  07/09/2014. FINDINGS: Cardiac silhouette is unremarkable. No pneumothorax or pleural effusion. The lungs are clear. The visualized skeletal structures are unremarkable. IMPRESSION: No acute cardiopulmonary process. Electronically Signed   By: Sydell Eva M.D.   On: 10/13/2023 16:26    Microbiology: Results for orders placed or performed during the hospital encounter of 10/13/23  Culture, blood (routine x 2)     Status: None  (Preliminary result)   Collection Time: 10/13/23  7:46 PM   Specimen: BLOOD RIGHT ARM  Result Value Ref Range Status   Specimen Description BLOOD RIGHT ARM  Final   Special Requests   Final    BOTTLES DRAWN AEROBIC AND ANAEROBIC Blood Culture results may not be optimal due to an inadequate volume of blood received in culture bottles   Culture   Final    NO GROWTH 4 DAYS Performed at [facility], [address] [place], [place] [address]    Report Status PENDING  Incomplete  Culture, blood (Routine X 2) w Reflex to ID Panel     Status: None (Preliminary result)   Collection Time: 10/14/23  6:31 AM   Specimen: BLOOD  Result Value Ref Range Status   Specimen Description BLOOD R FOOT  Final   Special Requests IN PEDIATRIC BOTTLE  Final   Culture   Final    NO GROWTH 3 DAYS Performed at [facility], [address] [place], [place] [address]    Report Status PENDING  Incomplete    Labs: CBC: Recent Labs  Lab 10/13/23 1946 10/13/23 2339 10/14/23 0216 10/15/23 0523 10/17/23 0550  WBC 16.5* 13.2* 17.2* 9.2 8.6  NEUTROABS  --   --   --   --  5.2  HGB 15.0 15.9 13.1 12.9* 13.4  HCT 45.1 45.9 38.7* 38.1* 39.5  MCV 95.1 90.7 93.3 93.6 93.8  PLT 373 359 376 290 216   Basic Metabolic Panel: Recent Labs  Lab 10/13/23 1453 10/13/23 2339 10/14/23 0216 10/15/23 0523 10/17/23 0550  NA 141  --  142 137 143  K 3.2*  --  3.7 3.5 3.2*  CL 104  --  109 110 111  CO2 21*  --  22 22 23   GLUCOSE 187*  --  101* 94 88  BUN 15  --  11 13 17   CREATININE 0.99  --  0.84 0.78 0.93  CALCIUM 10.0  --  8.9 8.8* 8.9  MG 2.2  --   --  2.1 2.1  PHOS  --  2.5  --  2.5  --    Liver Function Tests: Recent Labs  Lab 10/13/23 1453 10/15/23 0523 10/17/23 0550  AST 30 21 25   ALT 20 16 21   ALKPHOS 79 56 53  BILITOT 1.0 1.1 1.4*  PROT 9.1* 6.4* 6.9  ALBUMIN 4.9 3.2* 3.6     Discharge time spent: No charge for discharge summary since patient signed out AGAINST MEDICAL  ADVICE.  Signed: Verla Glaze, MD [facility] 10/17/2023

## 2023-10-17 NOTE — ED Provider Notes (Signed)
 [facility] Provider Note    Event Date/Time   First MD Initiated Contact with Patient 10/17/23 [phone]     (approximate)   History   Chest Pain   HPI  Samuel Weber is a 38 y.o. male with history of IV drug use disorder, hepatitis C who presents to the emergency department complaints of central to right-sided chest pain, continued vomiting with blood in his vomit.  He was just admitted to the hospital for the same and left yesterday AGAINST MEDICAL ADVICE.  States he is here to "readmit myself".  States he last used heroin 4 days ago.  No shortness of breath, fevers or cough, lower extremity swelling or pain.  No diarrhea, bloody stools or melena.  No abdominal discomfort.  No urinary symptoms.   History provided by patient, girlfriend.    Past Medical History:  Diagnosis Date   Fentanyl  use disorder, severe (HCC)    Hepatitis C    Heroin abuse (HCC)    IVDU (intravenous drug user)    MRSA infection    Opioid overdose (HCC)    Overdose 07/08/2014    Past Surgical History:  Procedure Laterality Date   APPENDECTOMY     I & D EXTREMITY Right 11/17/2021   Procedure: RIGHT HAND IRRIGATION AND DEBRIDEMENT;  Surgeon: Brunilda Capra, MD;  Location: [facility] OR;  Service: Orthopedics;  Laterality: Right;    MEDICATIONS:  Prior to Admission medications   Medication Sig Start Date End Date Taking? Authorizing Provider  acetaminophen  (TYLENOL ) 500 MG tablet Take 1,000 mg by mouth every 6 (six) hours as needed for mild pain.    [provider]  gabapentin  (NEURONTIN ) 600 MG tablet Take 600 mg by mouth at bedtime. Patient not taking: Reported on 10/13/2023 05/01/23   [provider]  ibuprofen  (ADVIL ) 600 MG tablet Take 1 tablet (600 mg total) by mouth every 6 (six) hours as needed for mild pain or moderate pain. Patient not taking: Reported on 10/14/2023 08/26/21   Debbra Fairy, PA-C  methocarbamol  (ROBAXIN ) 500 MG tablet Take 1 tablet (500 mg total) by  mouth 4 (four) times daily. Patient not taking: Reported on 10/13/2023 09/04/22   Cuthriell, Ardath Bears, PA-C  SUBOXONE  8-2 MG FILM Place 1 Film under the tongue 3 (three) times daily. 10/10/23   [provider]  traZODone (DESYREL) 50 MG tablet Take 50 mg by mouth at bedtime. 05/28/23   [provider]    Physical Exam   Triage Vital Signs: ED Triage Vitals  Encounter Vitals Group     BP 10/17/23 0338 (!) 148/99     Systolic BP Percentile --      Diastolic BP Percentile --      Pulse Rate 10/17/23 0338 80     Resp 10/17/23 0338 16     Temp 10/17/23 0338 98.1 F (36.7 C)     Temp Source 10/17/23 0338 Oral     SpO2 10/17/23 0338 97 %     Weight --      Height --      Head Circumference --      Peak Flow --      Pain Score 10/17/23 0339 5     Pain Loc --      Pain Education --      Exclude from Growth Chart --     Most recent vital signs: Vitals:   10/17/23 0338  BP: (!) 148/99  Pulse: 80  Resp: 16  Temp:  98.1 F (36.7 C)  SpO2: 97%    CONSTITUTIONAL: Alert, responds appropriately to questions.  Chronically ill-appearing but not in distress HEAD: Normocephalic, atraumatic EYES: Conjunctivae clear, pupils appear equal, sclera nonicteric ENT: normal nose; moist mucous membranes NECK: Supple, normal ROM CARD: RRR; S1 and S2 appreciated RESP: Normal chest excursion without splinting or tachypnea; breath sounds clear and equal bilaterally; no wheezes, no rhonchi, no rales, no hypoxia or respiratory distress, speaking full sentences ABD/GI: Non-distended; soft, non-tender, no rebound, no guarding, no peritoneal signs BACK: The back appears normal EXT: Normal ROM in all joints; no deformity noted, no edema SKIN: Normal color for age and race; warm; no rash on exposed skin NEURO: Moves all extremities equally, normal speech, normal gait PSYCH: The patient's mood and manner are appropriate.   ED Results / Procedures / Treatments   LABS: (all labs ordered  are listed, but only abnormal results are displayed) Labs Reviewed  COMPREHENSIVE METABOLIC PANEL WITH GFR - Abnormal; Notable for the following components:      Result Value   Potassium 3.2 (*)    Total Bilirubin 1.4 (*)    All other components within normal limits  LIPASE, BLOOD - Abnormal; Notable for the following components:   Lipase 59 (*)    All other components within normal limits  CBC WITH DIFFERENTIAL/PLATELET - Abnormal; Notable for the following components:   RBC 4.21 (*)    All other components within normal limits  ETHANOL  MAGNESIUM  URINALYSIS, ROUTINE W REFLEX MICROSCOPIC  URINE DRUG SCREEN, QUALITATIVE ([facility] ONLY)  TROPONIN I (HIGH SENSITIVITY)  TROPONIN I (HIGH SENSITIVITY)     EKG:    Date: 10/17/2023 3:36 AM  Rate: 94  Rhythm: normal sinus rhythm  QRS Axis: normal  Intervals: normal  ST/T Wave abnormalities: normal  Conduction Disutrbances: none  Narrative Interpretation: unremarkable   RADIOLOGY: My personal review and interpretation of imaging: Chest x-ray clear.  I have personally reviewed all radiology reports.   DG Chest 2 View Result Date: 10/17/2023 CLINICAL DATA:  Hematemesis and chest pain EXAM: CHEST - 2 VIEW COMPARISON:  Four days prior FINDINGS: Normal heart size and mediastinal contours. No acute infiltrate or edema. No effusion or pneumothorax. No acute osseous findings. IMPRESSION: No active cardiopulmonary disease. Electronically Signed   By: Ronnette Coke M.D.   On: 10/17/2023 05:36     PROCEDURES:  Critical Care performed: No     Procedures    IMPRESSION / MDM / ASSESSMENT AND PLAN / ED COURSE  I reviewed the triage vital signs and the nursing notes.    Patient here with complaints of atypical chest pain, vomiting.  History of IV drug use.  Had full workup and was admitted to the hospital but left AMA yesterday.  The patient is on the cardiac monitor to evaluate for evidence of arrhythmia and/or significant heart  rate changes.   DIFFERENTIAL DIAGNOSIS (includes but not limited to):   Mallory-Weiss tear, esophageal tear, esophagitis, GERD, viral gastroenteritis, opiate withdrawal, dehydration, low suspicion for ACS, PE, dissection, pneumonia, pneumothorax.  No fever or murmur to suggest endocarditis.   Patient's presentation is most consistent with acute presentation with potential threat to life or bodily function.   PLAN: Will obtain labs, urine, chest x-ray.  EKG reviewed and interpreted by myself and the radiologist and does show some T wave inversions in inferior leads.  QT interval is normal.  No risk factors for PE other than recent admission but patient is not tachycardic, tachypneic  or hypoxic.  Will give medications for symptomatic relief.   MEDICATIONS GIVEN IN ED: Medications  potassium chloride  SA (KLOR-CON  M) CR tablet 40 mEq (has no administration in time range)  sodium chloride  0.9 % bolus 1,000 mL (1,000 mLs Intravenous New Bag/Given 10/17/23 0539)  ketorolac  (TORADOL ) 30 MG/ML injection 30 mg (30 mg Intravenous Given 10/17/23 0540)  pantoprazole  (PROTONIX ) injection 40 mg (40 mg Intravenous Given 10/17/23 0539)  ondansetron  (ZOFRAN ) injection 4 mg (4 mg Intravenous Given 10/17/23 0540)     ED COURSE: First troponin negative.  Potassium of 3.2.  Will give oral replacement.  Normal magnesium.  Negative ethanol level.  Normal LFTs.  Lipase minimally elevated but abdominal exam benign.  Chest x-ray reviewed and interpreted by myself and the radiologist and is clear.  Patient has not having any vomiting here and is resting comfortably, hemodynamically stable.  Anticipate if second troponin is negative that he can be discharged home.  Discussed this with girlfriend at bedside.  They are comfortable with this plan.  Signed out to oncoming ED team to follow-up on second troponin and discharge if negative.  CONSULTS: Pending further workup.   OUTSIDE RECORDS REVIEWED: Reviewed recent  admission notes.       FINAL CLINICAL IMPRESSION(S) / ED DIAGNOSES   Final diagnoses:  Nonspecific chest pain     Rx / DC Orders   ED Discharge Orders          Ordered    Ambulatory Referral to Primary Care (Establish Care)        10/17/23 0655    ondansetron  (ZOFRAN -ODT) 4 MG disintegrating tablet  Every 6 hours PRN        10/17/23 5176             Note:  This document was prepared using Dragon voice recognition software and may include unintentional dictation errors.   Braniyah Besse, Clover Dao, DO 10/17/23 [phone]

## 2023-10-17 NOTE — Discharge Instructions (Addendum)
 You may alternate Tylenol  1000 mg every 6 hours as needed for pain, fever and Ibuprofen  800 mg every 6-8 hours as needed for pain, fever.  Please take Ibuprofen  with food.  Do not take more than 4000 mg of Tylenol  (acetaminophen ) in a 24 hour period.   Your labs, EKG, chest x-ray were reassuring today.

## 2023-10-18 LAB — CULTURE, BLOOD (ROUTINE X 2): Culture: NO GROWTH

## 2023-10-19 LAB — CULTURE, BLOOD (ROUTINE X 2): Culture: NO GROWTH

## 2023-11-18 ENCOUNTER — Emergency Department

## 2023-11-18 ENCOUNTER — Other Ambulatory Visit: Payer: Self-pay

## 2023-11-18 ENCOUNTER — Encounter: Payer: Self-pay | Admitting: Emergency Medicine

## 2023-11-18 ENCOUNTER — Inpatient Hospital Stay
Admission: EM | Admit: 2023-11-18 | Discharge: 2023-11-24 | DRG: 897 | Disposition: A | Discharge status: Home / Self Care | Attending: Family Medicine | Admitting: Family Medicine

## 2023-11-18 DIAGNOSIS — E876 Hypokalemia: Secondary | ICD-10-CM | POA: Diagnosis present

## 2023-11-18 DIAGNOSIS — R112 Nausea with vomiting, unspecified: Secondary | ICD-10-CM

## 2023-11-18 DIAGNOSIS — Z91148 Patient's other noncompliance with medication regimen for other reason: Secondary | ICD-10-CM | POA: Diagnosis not present

## 2023-11-18 DIAGNOSIS — F17210 Nicotine dependence, cigarettes, uncomplicated: Secondary | ICD-10-CM | POA: Diagnosis present

## 2023-11-18 DIAGNOSIS — Z8249 Family history of ischemic heart disease and other diseases of the circulatory system: Secondary | ICD-10-CM

## 2023-11-18 DIAGNOSIS — F1123 Opioid dependence with withdrawal: Principal | ICD-10-CM | POA: Diagnosis present

## 2023-11-18 DIAGNOSIS — Z833 Family history of diabetes mellitus: Secondary | ICD-10-CM | POA: Diagnosis not present

## 2023-11-18 DIAGNOSIS — Z720 Tobacco use: Secondary | ICD-10-CM | POA: Diagnosis not present

## 2023-11-18 DIAGNOSIS — F1990 Other psychoactive substance use, unspecified, uncomplicated: Secondary | ICD-10-CM | POA: Diagnosis present

## 2023-11-18 DIAGNOSIS — F1910 Other psychoactive substance abuse, uncomplicated: Secondary | ICD-10-CM | POA: Diagnosis present

## 2023-11-18 DIAGNOSIS — Z8614 Personal history of Methicillin resistant Staphylococcus aureus infection: Secondary | ICD-10-CM

## 2023-11-18 DIAGNOSIS — B1920 Unspecified viral hepatitis C without hepatic coma: Secondary | ICD-10-CM | POA: Diagnosis present

## 2023-11-18 DIAGNOSIS — F1193 Opioid use, unspecified with withdrawal: Secondary | ICD-10-CM | POA: Diagnosis not present

## 2023-11-18 HISTORY — DX: Other psychoactive substance abuse, uncomplicated: F19.10

## 2023-11-18 LAB — TROPONIN I (HIGH SENSITIVITY)
Troponin I (High Sensitivity): 9 ng/L (ref <18)
Troponin I (High Sensitivity): 15 ng/L (ref <18)

## 2023-11-18 LAB — BASIC METABOLIC PANEL WITH GFR
Anion gap: 13 (ref 5–15)
BUN: 12 mg/dL (ref 6–20)
CO2: 19 mmol/L — ABNORMAL LOW (ref 22–32)
Calcium: 9.3 mg/dL (ref 8.9–10.3)
Chloride: 110 mmol/L (ref 98–111)
Creatinine, Ser: 0.94 mg/dL (ref 0.61–1.24)
GFR, Estimated: >60 mL/min (ref >60)
Glucose, Bld: 108 mg/dL — ABNORMAL HIGH (ref 70–99)
Potassium: 2.9 mmol/L — ABNORMAL LOW (ref 3.5–5.1)
Sodium: 142 mmol/L (ref 135–145)

## 2023-11-18 LAB — CBC
HCT: 41.1 % (ref 39.0–52.0)
Hemoglobin: 14.5 g/dL (ref 13.0–17.0)
MCH: 31.7 pg (ref 26.0–34.0)
MCHC: 35.3 g/dL (ref 30.0–36.0)
MCV: 89.7 fL (ref 80.0–100.0)
Platelets: 396 10*3/uL (ref 150–400)
RBC: 4.58 MIL/uL (ref 4.22–5.81)
RDW: 13 % (ref 11.5–15.5)
WBC: 10 10*3/uL (ref 4.0–10.5)
nRBC: 0 % (ref 0.0–0.2)

## 2023-11-18 LAB — HEPATIC FUNCTION PANEL
ALT: 16 U/L (ref 0–44)
AST: 33 U/L (ref 15–41)
Albumin: 4 g/dL (ref 3.5–5.0)
Alkaline Phosphatase: 70 U/L (ref 38–126)
Bilirubin, Direct: 0.3 mg/dL — ABNORMAL HIGH (ref 0.0–0.2)
Indirect Bilirubin: 1 mg/dL — ABNORMAL HIGH (ref 0.3–0.9)
Total Bilirubin: 1.3 mg/dL — ABNORMAL HIGH (ref 0.0–1.2)
Total Protein: 7.3 g/dL (ref 6.5–8.1)

## 2023-11-18 LAB — MAGNESIUM: Magnesium: 2.3 mg/dL (ref 1.7–2.4)

## 2023-11-18 LAB — LIPASE, BLOOD: Lipase: 26 U/L (ref 11–51)

## 2023-11-18 LAB — ETHANOL: Alcohol, Ethyl (B): <15 mg/dL (ref <15)

## 2023-11-18 LAB — PHOSPHORUS: Phosphorus: <1 mg/dL — CL (ref 2.5–4.6)

## 2023-11-18 MED ORDER — SODIUM CHLORIDE 0.9 % IV SOLN
12.5000 mg | Freq: Once | INTRAVENOUS | Status: AC
  Administered 2023-11-18: 12.5 mg via INTRAVENOUS
  Filled 2023-11-18: qty 12.5

## 2023-11-18 MED ORDER — MORPHINE SULFATE (PF) 4 MG/ML IV SOLN
4.0000 mg | Freq: Once | INTRAVENOUS | Status: AC
  Administered 2023-11-18: 4 mg via INTRAVENOUS
  Filled 2023-11-18: qty 1

## 2023-11-18 MED ORDER — ONDANSETRON HCL 4 MG PO TABS: 4.0000 mg | ORAL_TABLET | Freq: Four times a day (QID) | ORAL | Status: DC | PRN

## 2023-11-18 MED ORDER — POTASSIUM PHOSPHATES 15 MMOLE/5ML IV SOLN
30.0000 mmol | Freq: Once | INTRAVENOUS | Status: AC
  Administered 2023-11-18: 30 mmol via INTRAVENOUS
  Filled 2023-11-18: qty 10

## 2023-11-18 MED ORDER — SODIUM CHLORIDE 0.9 % IV SOLN
12.5000 mg | Freq: Four times a day (QID) | INTRAVENOUS | Status: AC | PRN
  Administered 2023-11-19: 12.5 mg via INTRAVENOUS
  Filled 2023-11-18: qty 12.5

## 2023-11-18 MED ORDER — POTASSIUM CHLORIDE 10 MEQ/100ML IV SOLN
10.0000 meq | INTRAVENOUS | Status: DC
  Administered 2023-11-18: 10 meq via INTRAVENOUS
  Filled 2023-11-18 (×4): qty 100

## 2023-11-18 MED ORDER — ONDANSETRON HCL 4 MG/2ML IJ SOLN
4.0000 mg | Freq: Four times a day (QID) | INTRAMUSCULAR | Status: DC | PRN
  Administered 2023-11-18 – 2023-11-19 (×4): 4 mg via INTRAVENOUS
  Filled 2023-11-18 (×4): qty 2

## 2023-11-18 MED ORDER — IOHEXOL 300 MG/ML  SOLN
100.0000 mL | Freq: Once | INTRAMUSCULAR | Status: AC | PRN
  Administered 2023-11-18: 100 mL via INTRAVENOUS

## 2023-11-18 MED ORDER — HEPARIN SODIUM (PORCINE) 5000 UNIT/ML IJ SOLN
5000.0000 [IU] | Freq: Three times a day (TID) | INTRAMUSCULAR | Status: DC
  Administered 2023-11-19 – 2023-11-24 (×16): 5000 [IU] via SUBCUTANEOUS
  Filled 2023-11-18 (×16): qty 1

## 2023-11-18 MED ORDER — ACETAMINOPHEN 325 MG PO TABS
650.0000 mg | ORAL_TABLET | Freq: Four times a day (QID) | ORAL | Status: AC | PRN
  Administered 2023-11-18: 650 mg via ORAL
  Filled 2023-11-18: qty 2

## 2023-11-18 MED ORDER — PANTOPRAZOLE SODIUM 40 MG IV SOLR
40.0000 mg | Freq: Once | INTRAVENOUS | Status: AC
  Administered 2023-11-18: 40 mg via INTRAVENOUS
  Filled 2023-11-18: qty 10

## 2023-11-18 MED ORDER — POTASSIUM CHLORIDE 10 MEQ/100ML IV SOLN
10.0000 meq | INTRAVENOUS | Status: DC
  Administered 2023-11-18: 10 meq via INTRAVENOUS

## 2023-11-18 MED ORDER — LACTATED RINGERS IV SOLN: INTRAVENOUS | Status: AC

## 2023-11-18 MED ORDER — METHADONE HCL 10 MG PO TABS: 5.0000 mg | ORAL_TABLET | ORAL | Status: DC | PRN

## 2023-11-18 MED ORDER — MORPHINE SULFATE (PF) 2 MG/ML IV SOLN
2.0000 mg | INTRAVENOUS | Status: DC | PRN
  Administered 2023-11-18 – 2023-11-19 (×5): 2 mg via INTRAVENOUS
  Filled 2023-11-18 (×5): qty 1

## 2023-11-18 MED ORDER — POTASSIUM CHLORIDE 10 MEQ/100ML IV SOLN
10.0000 meq | INTRAVENOUS | Status: AC
  Administered 2023-11-18 (×5): 10 meq via INTRAVENOUS
  Filled 2023-11-18 (×2): qty 100

## 2023-11-18 MED ORDER — MORPHINE SULFATE (PF) 4 MG/ML IV SOLN: 4.0000 mg | INTRAVENOUS | Status: DC | PRN

## 2023-11-18 MED ORDER — ACETAMINOPHEN 650 MG RE SUPP: 650.0000 mg | Freq: Four times a day (QID) | RECTAL | Status: AC | PRN

## 2023-11-18 MED ORDER — LACTATED RINGERS IV BOLUS
1000.0000 mL | Freq: Once | INTRAVENOUS | Status: AC
  Administered 2023-11-18: 1000 mL via INTRAVENOUS

## 2023-11-18 MED ORDER — SENNOSIDES-DOCUSATE SODIUM 8.6-50 MG PO TABS: 1.0000 | ORAL_TABLET | Freq: Every evening | ORAL | Status: DC | PRN

## 2023-11-18 MED ORDER — METHADONE HCL 10 MG/ML IJ SOLN: 5.0000 mg | INTRAMUSCULAR | Status: DC | PRN

## 2023-11-18 MED ORDER — ONDANSETRON HCL 4 MG/2ML IJ SOLN
4.0000 mg | Freq: Once | INTRAMUSCULAR | Status: DC
Start: 2023-11-18 — End: 2023-11-18
  Filled 2023-11-18: qty 2

## 2023-11-18 MED ORDER — POTASSIUM CHLORIDE 10 MEQ/100ML IV SOLN
10.0000 meq | Freq: Once | INTRAVENOUS | Status: AC
Start: 2023-11-18 — End: 2023-11-18
  Administered 2023-11-18: 10 meq via INTRAVENOUS
  Filled 2023-11-18: qty 100

## 2023-11-18 MED ORDER — PROCHLORPERAZINE EDISYLATE 10 MG/2ML IJ SOLN
5.0000 mg | Freq: Once | INTRAMUSCULAR | Status: AC
  Administered 2023-11-18: 5 mg via INTRAVENOUS
  Filled 2023-11-18: qty 2

## 2023-11-18 MED ORDER — LORAZEPAM 2 MG/ML IJ SOLN
1.0000 mg | INTRAMUSCULAR | Status: DC | PRN
  Administered 2023-11-18 – 2023-11-19 (×2): 1 mg via INTRAVENOUS
  Filled 2023-11-18 (×2): qty 1

## 2023-11-18 MED ORDER — METHADONE HCL 10 MG/ML IJ SOLN
5.0000 mg | INTRAMUSCULAR | Status: DC | PRN
Start: 2023-11-18 — End: 2023-11-18

## 2023-11-18 MED ORDER — NICOTINE 21 MG/24HR TD PT24: 21.0000 mg | MEDICATED_PATCH | Freq: Every day | TRANSDERMAL | Status: AC | PRN

## 2023-11-18 NOTE — ED Provider Notes (Signed)
 [facility] Provider Note    Event Date/Time   First MD Initiated Contact with Patient 11/18/23 1130     (approximate)   History   Chest Pain and Emesis   HPI Samuel Weber is a 38 y.o. male with history of chronic opioid abuse presenting today for chest pain and vomiting.  Patient states daily use of heroin and fentanyl  with last use yesterday.  He then had onset of nausea with vomiting which has resulted in generalized abdominal pain and chest pain.  He reports occasionally seeing blood in his vomit.  Notes methadone use this morning which did not help with symptoms.  Has constipation.  No diarrhea or dysuria.  No trouble breathing or fevers.  Chart review: Patient with 2 different ED visits including hospital admission for heroin and fentanyl  withdrawal 1 month ago.  Left AMA during admission.     Physical Exam   Triage Vital Signs: ED Triage Vitals  Encounter Vitals Group     BP 11/18/23 1115 (!) 170/96     Systolic BP Percentile --      Diastolic BP Percentile --      Pulse Rate 11/18/23 1114 (!) 59     Resp 11/18/23 1114 18     Temp 11/18/23 1114 98.8 F (37.1 C)     Temp Source 11/18/23 1114 Oral     SpO2 11/18/23 1114 100 %     Weight 11/18/23 1113 175 lb (79.4 kg)     Height 11/18/23 1113 6\' 1"  (1.854 m)     Head Circumference --      Peak Flow --      Pain Score 11/18/23 1113 8     Pain Loc --      Pain Education --      Exclude from Growth Chart --     Most recent vital signs: Vitals:   11/18/23 1230 11/18/23 1300  BP: (!) 144/82 (!) 153/74  Pulse: 70 64  Resp: 15 15  Temp:    SpO2: 100% 100%   I have reviewed the vital signs. General:  Awake, alert. Tremulous and vomiting at bedside. Head:  Normocephalic, Atraumatic. EENT:  PERRL, EOMI, Oral mucosa pink and moist, Neck is supple. Cardiovascular: Regular rate, 2+ distal pulses. Respiratory:  Normal respiratory effort, symmetrical expansion, no distress.   Abdomen:  Generalized TTP throughout with no focality Extremities:  Moving all four extremities through full ROM without pain.   Neuro:  Alert and oriented.  Interacting appropriately.   Skin:  Warm, dry, no rash.   Psych: Appropriate affect.    ED Results / Procedures / Treatments   Labs (all labs ordered are listed, but only abnormal results are displayed) Labs Reviewed  BASIC METABOLIC PANEL WITH GFR - Abnormal; Notable for the following components:      Result Value   Potassium 2.9 (*)    CO2 19 (*)    Glucose, Bld 108 (*)    All other components within normal limits  HEPATIC FUNCTION PANEL - Abnormal; Notable for the following components:   Total Bilirubin 1.3 (*)    Bilirubin, Direct 0.3 (*)    Indirect Bilirubin 1.0 (*)    All other components within normal limits  CBC  ETHANOL  LIPASE, BLOOD  MAGNESIUM  TROPONIN I (HIGH SENSITIVITY)  TROPONIN I (HIGH SENSITIVITY)     EKG My EKG interpretation: Normal sinus rhythm with occasional PVC.  No acute ST elevations or depressions.  Normal intervals.  RADIOLOGY Independently interpreted CT abdomen/pelvis with no acute pathology   PROCEDURES:  Critical Care performed: No  Procedures   MEDICATIONS ORDERED IN ED: Medications  pantoprazole  (PROTONIX ) injection 40 mg (40 mg Intravenous Given 11/18/23 1210)  lactated ringers  bolus 1,000 mL (1,000 mLs Intravenous New Bag/Given 11/18/23 1209)  promethazine  (PHENERGAN ) 12.5 mg in sodium chloride  0.9 % 50 mL IVPB (0 mg Intravenous Stopped 11/18/23 1237)  potassium chloride  10 mEq in 100 mL IVPB (10 mEq Intravenous New Bag/Given 11/18/23 1256)  prochlorperazine  (COMPAZINE ) injection 5 mg (5 mg Intravenous Given 11/18/23 1253)  morphine  (PF) 4 MG/ML injection 4 mg (4 mg Intravenous Given 11/18/23 1253)  iohexol  (OMNIPAQUE ) 300 MG/ML solution 100 mL (100 mLs Intravenous Contrast Given 11/18/23 1328)     IMPRESSION / MDM / ASSESSMENT AND PLAN / ED COURSE  I reviewed the triage vital  signs and the nursing notes.                              Differential diagnosis includes, but is not limited to, polysubstance use withdrawal, dehydration, pancreatitis, hepatitis, colitis, enteritis  Patient's presentation is most consistent with acute complicated illness / injury requiring diagnostic workup.  Patient is a 38 year old male presenting today for withdrawal from opioids.  Vital signs are stable on arrival but patient does appear tremulous and vomiting.  Do not see blood in the throat at this time but reported that at home.  Will give 1 L fluids, Phenergan , and Protonix .  Generalized tenderness to palpation throughout the abdomen which seems more consistent with his withdrawal symptoms but will monitor lab workup and pain symptoms to see if-year-old require CT imaging although not warranted immediately. Patient was redosed with Compazine  and morphine  with some improvement in symptoms but still having ongoing vomiting.  Labs with hypokalemia which was repleted.  Patient with ongoing vomiting and CT ordered.  CT shows no acute pathology.  Still having ongoing symptoms and unable to tolerate p.o.  Will admit to hospitalist for acute withdrawal symptoms.  The patient is on the cardiac monitor to evaluate for evidence of arrhythmia and/or significant heart rate changes. Clinical Course as of 11/18/23 1427  Mon Nov 18, 2023  1247 Patient with significant ongoing vomiting and sharp abdominal pain.  Will give Compazine , morphine , potassium repletion as well as get CT of the abdomen pelvis for further evaluation. [DW]  1408 Patient reassessed and still tremulous at this time with ongoing withdrawal symptoms and unable to tolerate p.o.  Given 2 rounds of antiemetics, pain medicine, and fluids, will discuss with hospitalist for admission for ongoing management of withdrawal symptoms. [DW]    Clinical Course User Index [DW] Kandee Orion, MD     FINAL CLINICAL IMPRESSION(S) / ED DIAGNOSES    Final diagnoses:  Opioid withdrawal (HCC)  Nausea and vomiting, unspecified vomiting type  Hypokalemia     Rx / DC Orders   ED Discharge Orders     None        Note:  This document was prepared using Dragon voice recognition software and may include unintentional dictation errors.   Kandee Orion, MD 11/18/23 [phone]

## 2023-11-18 NOTE — ED Triage Notes (Signed)
 Pt via POV from home. Pt c/o mid-sternal CP that started this AM reports that he is detoxing from Fentanyl  and Heroin last use was yesterday. Pt states he took some methadone this morning. Denies ETOH use. Pt is A&Ox4 and NAD

## 2023-11-18 NOTE — Assessment & Plan Note (Signed)
 Methadone 5 mg p.o. every 4 hours as needed for withdrawal when patient is able to tolerate p.o. intake  Discussed with pharmacy there are no methadone IV/IM formula available at [facility] at this time Morphine  2 mg IV every 4 hours as needed for moderate withdrawal symptoms, 1 day ordered; morphine  4 mg IV every 4 hours as needed for severe opioid withdrawal symptoms, 1 day ordered

## 2023-11-18 NOTE — Assessment & Plan Note (Signed)
 Supplement with potassium phosphate 30 mmol one time dose ordered on admission Check phosphorus level in the AM

## 2023-11-18 NOTE — ED Notes (Signed)
 Nausea briefly decreased but then started actively vomiting. MD aware

## 2023-11-18 NOTE — ED Notes (Signed)
Pt vomited about 100cc.

## 2023-11-18 NOTE — Hospital Course (Signed)
 Ms. Samuel Weber is a 38 year old male with history of IV drug use, polysubstance abuse, history of opioid withdrawal, tobacco abuse, drug overdose, HCV, history of MRSA, who presents emergency department for chief concerns of intractable nausea, vomiting.  Vitals in the ED showed T of 98.8, rr of 19, hr 57, blood pressure 146/70, SpO2 100% on room air.  Serum sodium is 142, potassium 2.9, chloride 110, bicarb 19, BUN of 12, serum creatinine 0.94, EGFR greater than 60, nonfasting blood glucose 108, WBC 10.0, hemoglobin 14.5, platelets of 396.  HS troponin was 9.  Magnesium has been ordered and pending collection.  ED treatment: Morphine  4 mg IV one-time dose, ondansetron  4 mg IV one-time dose, Protonix  40 mg IV one-time dose, Compazine  5 milligrams IV one-time dose, potassium chloride  10 mill equivalent IV, one-time dose per EDP, LR 1 L bolus.

## 2023-11-18 NOTE — ED Notes (Signed)
 Pt back from CT. Pt's friend at bedside.

## 2023-11-18 NOTE — Assessment & Plan Note (Signed)
 Magnesium level check pending collection at this time Status post potassium chloride  10 mill equivalent IV one-time dose per EDP Potassium chloride  10 mill equivalent, 5 doses ordered on admission Recheck BMP in the a.m.

## 2023-11-18 NOTE — ED Notes (Signed)
 Pt called RN into room. Stated left arm near IV site was hurting. RN stopped IV fluids and found IV mostly out. IV removed and order placed for US  IV.

## 2023-11-18 NOTE — Assessment & Plan Note (Signed)
 -  As needed nicotine patch ordered ?

## 2023-11-18 NOTE — ED Notes (Signed)
 Pt actively vomiting about 150 cc

## 2023-11-18 NOTE — H&P (Addendum)
 History and Physical   Samuel Weber ZOX:[id number] DOB: 1985/11/16 DOA: 11/18/2023  PCP: Pcp, No Patient coming from: Home via POV  I have personally briefly reviewed patient's old medical records in [facility] EMR.  Chief Concern: Chest pain, intractable nausea and vomiting  HPI: Ms. Samuel Weber is a 38 year old male with history of IV drug use, polysubstance abuse, history of opioid withdrawal, tobacco abuse, drug overdose, HCV, history of MRSA, who presents emergency department for chief concerns of intractable nausea, vomiting.  Vitals in the ED showed T of 98.8, rr of 19, hr 57, blood pressure 146/70, SpO2 100% on room air.  Serum sodium is 142, potassium 2.9, chloride 110, bicarb 19, BUN of 12, serum creatinine 0.94, EGFR greater than 60, nonfasting blood glucose 108, WBC 10.0, hemoglobin 14.5, platelets of 396.  HS troponin was 9.  Magnesium has been ordered and pending collection.  ED treatment: Morphine  4 mg IV one-time dose, ondansetron  4 mg IV one-time dose, Protonix  40 mg IV one-time dose, Compazine  5 milligrams IV one-time dose, potassium chloride  10 mill equivalent IV, one-time dose per EDP, LR 1 L bolus. -------------------------------------- At bedside, patient is able to tell me his first and last name, age, location, current calendar year.  His last use of fentanyl  and heroin IV was yesterday late morning.    He states he is trying to quit.  He started having nausea and vomiting last evening.  When the nausea and vomiting did not stop he presented to the hospital for further evaluation.  Social history: He lives at home with his wife.  He denies EtOH use.  He smokes cigarettes.  Endorses IV drug use.  He currently does not work.  ROS: Constitutional: no weight change, no fever ENT/Mouth: no sore throat, no rhinorrhea Eyes: no eye pain, no vision changes Cardiovascular: no chest pain, no dyspnea,  no edema, no palpitations Respiratory: no cough, no sputum, no  wheezing Gastrointestinal: + nausea, + vomiting, no diarrhea, no constipation Genitourinary: no urinary incontinence, no dysuria, no hematuria Musculoskeletal: no arthralgias, no myalgias Skin: no skin lesions, no pruritus, Neuro: + weakness, no loss of consciousness, no syncope Psych: no anxiety, no depression, + decrease appetite Heme/Lymph: no bruising, no bleeding  ED Course: Discussed with EDP, patient requiring hospitalization for chief concerns of intractable nausea and vomiting secondary to opioid withdrawal.  Assessment/Plan  Principal Problem:   Intractable nausea and vomiting Active Problems:   IVDU (intravenous drug user)   Polysubstance abuse (HCC)   Hypokalemia   Opioid withdrawal (HCC)   Tobacco abuse   Hypophosphatemia   Assessment and Plan:  * Intractable nausea and vomiting Check magnesium, phosphorus Replace potassium as appropriate Status post LR 1 L bolus per EDP Continue with LR infusion at 125 mL/h, 1 day ordered  Opioid withdrawal (HCC) Methadone 5 mg p.o. every 4 hours as needed for withdrawal when patient is able to tolerate p.o. intake  Discussed with pharmacy there are no methadone IV/IM formula available at [facility] at this time Morphine  2 mg IV every 4 hours as needed for moderate withdrawal symptoms, 1 day ordered; morphine  4 mg IV every 4 hours as needed for severe opioid withdrawal symptoms, 1 day ordered  Hypokalemia Magnesium level check pending collection at this time Status post potassium chloride  10 mill equivalent IV one-time dose per EDP Potassium chloride  10 mill equivalent, 5 doses ordered on admission Recheck BMP in the a.m.  Tobacco abuse As needed nicotine  patch ordered  Hypophosphatemia Supplement with potassium  phosphate 30 mmol one time dose ordered on admission Check phosphorus level in the AM  Chart reviewed.   DVT prophylaxis: Heparin  5000 units subcutaneous every 8 hours Code Status: Full code Diet: Clear liquid  diet with orders to advance as tolerated to regular diet Family Communication: Updated his wife at bedside with patient's permission Disposition Plan: Pending clinical course; guarded prognosis Consults called: None at this time Admission status: PCU, inpatient  Past Medical History:  Diagnosis Date   Fentanyl  use disorder, severe (HCC)    Hepatitis C    Heroin abuse (HCC)    IVDU (intravenous drug user)    MRSA infection    Opioid overdose (HCC)    Overdose 07/08/2014   Past Surgical History:  Procedure Laterality Date   APPENDECTOMY     I & D EXTREMITY Right 11/17/2021   Procedure: RIGHT HAND IRRIGATION AND DEBRIDEMENT;  Surgeon: Brunilda Capra, MD;  Location: [facility] OR;  Service: Orthopedics;  Laterality: Right;   Social History:  reports that he has been smoking cigarettes. He does not have any smokeless tobacco history on file. He reports that he does not currently use drugs after having used the following drugs: IV, Cocaine, Marijuana, and Heroin. He reports that he does not drink alcohol.  No Known Allergies Family History  Problem Relation Age of Onset   Hypertension Mother    Diabetes Mother    Hypertension Father    Family history: Family history reviewed and not pertinent.  Prior to Admission medications   Medication Sig Start Date End Date Taking? Authorizing Provider  traZODone (DESYREL) 50 MG tablet Take 50 mg by mouth at bedtime. 10/18/23  Yes [provider]  promethazine  (PHENERGAN ) 12.5 MG tablet Take 1 tablet (12.5 mg total) by mouth every 6 (six) hours as needed for nausea or vomiting. Patient not taking: Reported on 11/18/2023 10/17/23   Twilla Galea, MD   Physical Exam: Vitals:   11/18/23 1200 11/18/23 1230 11/18/23 1300 11/18/23 1500  BP: (!) 146/70 (!) 144/82 (!) 153/74 138/63  Pulse: (!) 57 70 64 69  Resp: 19 15 15 11   Temp:      TempSrc:      SpO2: 100% 100% 100% 100%  Weight:      Height:       Constitutional: appears age-appropriate,  anxious, uncomfortable Eyes: PERRL, lids and conjunctivae normal ENMT: Mucous membranes are moist. Posterior pharynx clear of any exudate or lesions. Age-appropriate dentition. Hearing appropriate Neck: normal, supple, no masses, no thyromegaly Respiratory: clear to auscultation bilaterally, no wheezing, no crackles. Normal respiratory effort. No accessory muscle use.  Cardiovascular: Regular rate and rhythm, no murmurs / rubs / gallops. No extremity edema. 2+ pedal pulses. No carotid bruits.  Abdomen: no tenderness, no masses palpated, no hepatosplenomegaly. Bowel sounds positive.  Musculoskeletal: no clubbing / cyanosis. No joint deformity upper and lower extremities. Good ROM, no contractures, no atrophy. Normal muscle tone.  Skin: no rashes, lesions, ulcers. No induration.  Multiple tattoos, appears chronic and well-healed. Neurologic: Sensation intact. Strength 5/5 in all 4.  Psychiatric: Normal judgment and insight. Alert and oriented x 3. Normal mood.   EKG: independently reviewed, showing sinus rhythm with rate of 67, QTc 458, frequent PVCs  Chest x-ray on Admission: I personally reviewed and I agree with radiologist reading as below.  CT ABDOMEN PELVIS W CONTRAST Result Date: 11/18/2023 CLINICAL DATA:  Upper and right-sided abdominal pain.  Vomiting. EXAM: CT ABDOMEN AND PELVIS WITH CONTRAST TECHNIQUE: Multidetector CT imaging of  the abdomen and pelvis was performed using the standard protocol following bolus administration of intravenous contrast. RADIATION DOSE REDUCTION: This exam was performed according to the departmental dose-optimization program which includes automated exposure control, adjustment of the mA and/or kV according to patient size and/or use of iterative reconstruction technique. CONTRAST:  OMNIPAQUE  IOHEXOL  300 MG/ML  SOLN COMPARISON:  Most recent CT 10/13/2023 FINDINGS: Lower chest: Clear lung bases. Minimal wall thickening of the distal esophagus. Hepatobiliary:  Focal fatty infiltration adjacent to the falciform ligament. Scattered tiny hepatic hypodensities, unchanged, too small to characterize but likely small cysts. No suspicious liver lesion. Gallbladder physiologically distended, no calcified stone. No biliary dilatation. Pancreas: No ductal dilatation or inflammation. Spleen: Normal in size without focal abnormality. Adrenals/Urinary Tract: No adrenal nodule. No hydronephrosis. No visible renal calculi. No evidence of renal inflammation. Tiny hypodensities in the kidneys, too small to characterize. No further follow-up imaging is recommended. Unremarkable urinary bladder, no bladder wall thickening. Stomach/Bowel: Minimal wall thickening of the distal esophagus. No evidence of gastric wall thickening or inflammation. No small bowel obstruction or inflammatory change. Scattered fluid within nondilated small bowel in the lower abdomen. Appendectomy per history. Small to moderate volume of stool in the colon. No colonic wall thickening. Vascular/Lymphatic: No acute vascular findings. Normal caliber abdominal aorta. Patent portal vein. No abdominopelvic adenopathy. Reproductive: Prostate is unremarkable. Other: No free air, free fluid, or intra-abdominal fluid collection. No abdominal wall hernia. Musculoskeletal: There are no acute or suspicious osseous abnormalities. Mild scoliotic curvature of the spine. IMPRESSION: 1. Minimal wall thickening of the distal esophagus, can be seen with esophagitis. 2. Scattered fluid within nondilated small bowel in the lower abdomen, nonspecific. No bowel obstruction or inflammation. Electronically Signed   By: Chadwick Colonel M.D.   On: 11/18/2023 13:59   Labs on Admission: I have personally reviewed following labs  CBC: Recent Labs  Lab 11/18/23 1141  WBC 10.0  HGB 14.5  HCT 41.1  MCV 89.7  PLT 396   Basic Metabolic Panel: Recent Labs  Lab 11/18/23 1141  NA 142  K 2.9*  CL 110  CO2 19*  GLUCOSE 108*  BUN 12   CREATININE 0.94  CALCIUM 9.3  MG 2.3  PHOS <1.0*   GFR: Estimated Creatinine Clearance: 122 mL/min (by C-G formula based on SCr of 0.94 mg/dL).  Liver Function Tests: Recent Labs  Lab 11/18/23 1206  AST 33  ALT 16  ALKPHOS 70  BILITOT 1.3*  PROT 7.3  ALBUMIN 4.0   Recent Labs  Lab 11/18/23 1206  LIPASE 26   Urine analysis:    Component Value Date/Time   COLORURINE AMBER (A) 10/17/2023 0730   APPEARANCEUR CLOUDY (A) 10/17/2023 0730   LABSPEC 1.027 10/17/2023 0730   PHURINE 7.0 10/17/2023 0730   GLUCOSEU NEGATIVE 10/17/2023 0730   HGBUR NEGATIVE 10/17/2023 0730   BILIRUBINUR NEGATIVE 10/17/2023 0730   KETONESUR 5 (A) 10/17/2023 0730   PROTEINUR 30 (A) 10/17/2023 0730   UROBILINOGEN 1.0 09/12/2007 2153   NITRITE NEGATIVE 10/17/2023 0730   LEUKOCYTESUR NEGATIVE 10/17/2023 0730   CRITICAL CARE Performed by: Dr. Reinhold Carbine  Total critical care time: 32 minutes  Critical care time was exclusive of separately billable procedures and treating other patients.  Critical care was necessary to treat or prevent imminent or life-threatening deterioration.  Critical care was time spent personally by me on the following activities: development of treatment plan with patient as well as nursing, pharmacy, discussions with consultants, evaluation of patient's response to treatment,  examination of patient, obtaining history from patient or surrogate, ordering and performing treatments and interventions, ordering and review of laboratory studies, ordering and review of radiographic studies, pulse oximetry and re-evaluation of patient's condition.  This document was prepared using Dragon Voice Recognition software and may include unintentional dictation errors.  Dr. Reinhold Carbine [facility]  If 7PM-7AM, please contact overnight-coverage provider If 7AM-7PM, please contact day attending provider [email or web address]  11/18/2023, 3:22 PM

## 2023-11-18 NOTE — Assessment & Plan Note (Signed)
 Check magnesium, phosphorus Replace potassium as appropriate Status post LR 1 L bolus per EDP Continue with LR infusion at 125 mL/h, 1 day ordered

## 2023-11-19 DIAGNOSIS — F1193 Opioid use, unspecified with withdrawal: Secondary | ICD-10-CM | POA: Diagnosis not present

## 2023-11-19 DIAGNOSIS — R112 Nausea with vomiting, unspecified: Secondary | ICD-10-CM | POA: Diagnosis not present

## 2023-11-19 LAB — PHOSPHORUS: Phosphorus: 4.4 mg/dL (ref 2.5–4.6)

## 2023-11-19 LAB — BASIC METABOLIC PANEL WITH GFR
Anion gap: 10 (ref 5–15)
BUN: 7 mg/dL (ref 6–20)
CO2: 20 mmol/L — ABNORMAL LOW (ref 22–32)
Calcium: 8.9 mg/dL (ref 8.9–10.3)
Chloride: 110 mmol/L (ref 98–111)
Creatinine, Ser: 0.84 mg/dL (ref 0.61–1.24)
GFR, Estimated: >60 mL/min (ref >60)
Glucose, Bld: 79 mg/dL (ref 70–99)
Potassium: 4.8 mmol/L (ref 3.5–5.1)
Sodium: 140 mmol/L (ref 135–145)

## 2023-11-19 LAB — CBC
HCT: 39.2 % (ref 39.0–52.0)
Hemoglobin: 13.6 g/dL (ref 13.0–17.0)
MCH: 31.1 pg (ref 26.0–34.0)
MCHC: 34.7 g/dL (ref 30.0–36.0)
MCV: 89.7 fL (ref 80.0–100.0)
Platelets: 144 10*3/uL — ABNORMAL LOW (ref 150–400)
RBC: 4.37 MIL/uL (ref 4.22–5.81)
RDW: 13.2 % (ref 11.5–15.5)
WBC: 12.8 10*3/uL — ABNORMAL HIGH (ref 4.0–10.5)
nRBC: 0 % (ref 0.0–0.2)

## 2023-11-19 MED ORDER — DICYCLOMINE HCL 20 MG PO TABS
20.0000 mg | ORAL_TABLET | Freq: Four times a day (QID) | ORAL | Status: DC | PRN
  Administered 2023-11-20 – 2023-11-21 (×2): 20 mg via ORAL
  Filled 2023-11-19 (×3): qty 1

## 2023-11-19 MED ORDER — MORPHINE SULFATE (PF) 2 MG/ML IV SOLN
2.0000 mg | INTRAVENOUS | Status: DC | PRN
  Administered 2023-11-19 (×2): 2 mg via INTRAVENOUS
  Filled 2023-11-19 (×2): qty 1

## 2023-11-19 MED ORDER — NAPROXEN 500 MG PO TABS
500.0000 mg | ORAL_TABLET | Freq: Two times a day (BID) | ORAL | Status: DC | PRN
  Administered 2023-11-21 – 2023-11-24 (×2): 500 mg via ORAL
  Filled 2023-11-19 (×3): qty 1

## 2023-11-19 MED ORDER — MORPHINE SULFATE (PF) 4 MG/ML IV SOLN: 4.0000 mg | INTRAVENOUS | Status: DC | PRN

## 2023-11-19 MED ORDER — HYDROXYZINE HCL 25 MG PO TABS
25.0000 mg | ORAL_TABLET | Freq: Four times a day (QID) | ORAL | Status: DC | PRN
  Administered 2023-11-20 – 2023-11-24 (×10): 25 mg via ORAL
  Filled 2023-11-19 (×10): qty 1

## 2023-11-19 MED ORDER — LOPERAMIDE HCL 2 MG PO CAPS: 2.0000 mg | ORAL_CAPSULE | ORAL | Status: DC | PRN

## 2023-11-19 MED ORDER — BENZOCAINE 10 % MT GEL
Freq: Three times a day (TID) | OROMUCOSAL | Status: DC | PRN
  Administered 2023-11-19: 1 via OROMUCOSAL
  Filled 2023-11-19: qty 9

## 2023-11-19 MED ORDER — ONDANSETRON 4 MG PO TBDP
4.0000 mg | ORAL_TABLET | Freq: Four times a day (QID) | ORAL | Status: DC | PRN
  Administered 2023-11-20 – 2023-11-24 (×11): 4 mg via ORAL
  Filled 2023-11-19 (×15): qty 1

## 2023-11-19 MED ORDER — LORAZEPAM 2 MG/ML IJ SOLN
1.0000 mg | INTRAMUSCULAR | Status: DC | PRN
  Administered 2023-11-19 – 2023-11-21 (×5): 1 mg via INTRAVENOUS
  Filled 2023-11-19 (×5): qty 1

## 2023-11-19 MED ORDER — METHOCARBAMOL 500 MG PO TABS
500.0000 mg | ORAL_TABLET | Freq: Three times a day (TID) | ORAL | Status: DC | PRN
  Administered 2023-11-20 – 2023-11-24 (×4): 500 mg via ORAL
  Filled 2023-11-19 (×4): qty 1

## 2023-11-19 MED ORDER — MORPHINE SULFATE (PF) 2 MG/ML IV SOLN
2.0000 mg | INTRAVENOUS | Status: AC | PRN
  Administered 2023-11-19 – 2023-11-20 (×4): 2 mg via INTRAVENOUS
  Filled 2023-11-19 (×4): qty 1

## 2023-11-19 NOTE — Consult Note (Signed)
 [facility] Psychiatric Consult Initial  Patient Name: .Samuel Weber  MRN: [id number]  DOB: December 02, 1985  Consult Order details:  Orders (From admission, onward)     Start     Ordered   11/19/23 1233  IP CONSULT TO PSYCHIATRY       Ordering Provider: Ree Candy, MD  Provider:  (Not yet assigned)  Question Answer Comment  Location [facility]   Reason for Consult? addiction medicine, opioid withdrawal treatment      11/19/23 1232             Mode of Visit: In person    Psychiatry Consult Evaluation  Service Date: Nov 19, 2023 LOS:  LOS: 1 day  Chief Complaint " I want a life without drugs"   Chart review : Samuel Weber is a 38 year old male with history of IV drug use, polysubstance abuse, history of opioid withdrawal, tobacco abuse, drug overdose, HCV, history of MRSA, who presents emergency department for chief concerns of intractable nausea, vomiting.    Vitals in the ED showed T of 98.8, rr of 19, hr 57, blood pressure 146/70, SpO2 100% on room air.   Serum sodium is 142, potassium 2.9, chloride 110, bicarb 19, BUN of 12, serum creatinine 0.94, EGFR greater than 60, nonfasting blood glucose 108, WBC 10.0, hemoglobin 14.5, platelets of 396.   HS troponin was 9.  Magnesium has been ordered and pending collection.   ED treatment: Morphine  4 mg IV one-time dose, ondansetron  4 mg IV one-time dose, Protonix  40 mg IV one-time dose, Compazine  5 milligrams IV one-time dose, potassium chloride  10 mill equivalent IV, one-time dose per EDP, LR 1 L bolus.  His last use of fentanyl  and heroin IV was yesterday late morning.     He stated he is trying to quit.  He started having nausea and vomiting last evening.  When the nausea and vomiting did not stop he presented to the hospital for further evaluation.   He lives at home with his wife.  He denies EtOH use.  He smokes cigarettes.  Endorses IV drug use.  He currently does not work.  Per nursing,  patient has been refusing Suboxone  and Methadone, reporting he does not like the way they make him feel.   Upon a face-to-face assessment today:  38 year-old male in bed, awake, wife beside him. He is cooperative upon approach. Appears disheveled, unkept. Alert  and oriented x 4. His thought process is coherent and goal-directed. He denies SI/HI/AVH. Denies feeling very depressed/anxious. He appears calm, talking to his wife who seems to be supportive.  Patient does not appear to be preoccupied or responding to internal stimuli. He denies hallucinations.  His eye contact is fair. Speech is normal. Mood is appropriate to situation. Patient admits to having "a serious problem with drugs".  His drugs of choice include fentanyl , Meth and heroin but has used many more. Reports he was put on Suboxone  and Methadone but "I will never take them again". Reports he gets very sick from these medications. Patient tried to "naturally detox" and started experiencing medical symptoms.  He reports a hx of treatment "but now I am ready, I want a life without drugs". Patient reports his wife uses the same drugs and wants to get treatment as well. Patient reports he is willing to medically detox then continue with  a long-term rehab service. His reports that his wife is looking for treatment services as well "because we both want to  be drug-free".  Patient reports no hx of a mental illness per diagnosis "but I was told I am Bipolar, my mom is manic Bipolar".  He currently denies pain. Denies respiratory distress. Denies chest/back/abdominal pain. Reports he has been experiencing nausea/vomiting which is managed by Zofran /phenergan .  He expresses motivation for treatment and rehab.    Primary Psychiatric Diagnoses  Polysubstance Abuse   Assessment  Samuel Weber is a 38 y.o. male admitted: Medicallyfor 11/18/2023 11:26 AM  opioid withdrawal symptoms. Aaron Aas He carries the psychiatric diagnoses of Polysubstance abuse disorder and  has a past medical history of  Opioid dependence.   His current presentation of withdrawals  is most consistent with his substance abuse . He meets criteria for SA treatment and rehab  based on his presenting symptoms and SA  history.  he reports that  current outpatient psychotropic medications include methadone  and Suboxone .  . He was not  compliant with medications prior to admission as evidenced by his withdrawal symptoms. On initial examination, patient  is calm and cooperative, expressing his desire for SA treatment and rehab.  He denies SI/HI/AVH. Please see plan below for detailed recommendations.   Diagnoses:  Active Hospital problems: Principal Problem:   Intractable nausea and vomiting Active Problems:   Tobacco abuse   IVDU (intravenous drug user)   Polysubstance abuse (HCC)   Hypokalemia   Opioid withdrawal (HCC)   Hypophosphatemia    Plan   ## Psychiatric Medication Recommendations:  Clonidine  detox protocol  ## Medical Decision Making Capacity: Not specifically addressed in this encounter  ## Further Work-up:  -- Plan to transfer to [facility] for SA treatment when a bed is available EKG  -- Pertinent labwork reviewed earlier this admission includes: All labs reviewed   ## Disposition:-- We recommend inpatient psychiatric hospitalization when medically cleared. Patient is under voluntary admission status at this time; please IVC if attempts to leave hospital.  ## Behavioral / Environmental: - No specific recommendations at this time.     ## Safety and Observation Level:  - Based on my clinical evaluation, I estimate the patient to be at low risk of self harm in the current setting. - At this time, we recommend  routine. This decision is based on my review of the chart including patient's history and current presentation, interview of the patient, mental status examination, and consideration of suicide risk including evaluating suicidal ideation, plan, intent, suicidal or  self-harm behaviors, risk factors, and protective factors. This judgment is based on our ability to directly address suicide risk, implement suicide prevention strategies, and develop a safety plan while the patient is in the clinical setting. Please contact our team if there is a concern that risk level has changed.  CSSR Risk Category:C-SSRS RISK CATEGORY: No Risk  Suicide Risk Assessment: Patient has following modifiable risk factors for suicide: medication noncompliance, which we are addressing by recommending inpatient treatment. Patient has following non-modifiable or demographic risk factors for suicide: male gender Patient has the following protective factors against suicide: Supportive family and Minor children in the home  Thank you for this consult request. Recommendations have been communicated to the primary team.  We will recommend Substance Abuse Treatment at [facility] at this time.   Elston Halsted, NP       History of Present Illness  Relevant Aspects of [facility] Course:  Admitted on 11/18/2023 for Substance abuse.   Patient Report:  "I want a life without drugs"  Psych ROS:  Depression: NA Anxiety:  NA Mania (lifetime and current): NA Psychosis: (lifetime and current): NA  Collateral information:  Contacted : NA   Review of Systems  Constitutional: Negative.   HENT: Negative.    Eyes: Negative.   Respiratory: Negative.    Cardiovascular: Negative.   Gastrointestinal: Negative.   Genitourinary: Negative.   Musculoskeletal: Negative.   Skin: Negative.   Neurological: Negative.   Endo/Heme/Allergies: Negative.   Psychiatric/Behavioral:  Positive for substance abuse.      Psychiatric and Social History  Psychiatric History:  Information collected from Patient/chart review/nursing  Prev Dx/Sx: Polysubstance abuse Current Psych Provider: NA Home Meds (current): Methadone/Suboxone  Previous Med Trials: NA Therapy: NA  Prior Psych  Hospitalization: Reported  Prior Self Harm: NA Prior Violence: NA  Family Psych History: Mom has Bipolar, manic Family Hx suicide: NA  Social History:  Developmental Hx: NA Educational Hx: NA Occupational Hx: NA Legal Hx: NA Living Situation: Lives with wife and kids Spiritual Hx: NA Access to weapons/lethal means: NA   Substance History Alcohol: NA  Type of alcohol NA Last Drink NA Number of drinks per day NA History of alcohol withdrawal seizures NA History of DT's NA Tobacco: Reported Illicit drugs: Reported Prescription drug abuse: NA Rehab hx: reported  Exam Findings  Physical Exam:  Vital Signs:  Temp:  [97.9 F (36.6 C)-98.8 F (37.1 C)] 98.7 F (37.1 C) (05/27 1627) Pulse Rate:  [74-94] 87 (05/27 1627) Resp:  [17-19] 17 (05/27 1110) BP: (145-155)/(60-95) 155/95 (05/27 1627) SpO2:  [99 %-100 %] 100 % (05/27 1627) Blood pressure (!) 155/95, pulse 87, temperature 98.7 F (37.1 C), resp. rate 17, height 6\' 1"  (1.854 m), weight 79.4 kg, SpO2 100%. Body mass index is 23.09 kg/m.  Physical Exam Vitals and nursing note reviewed.  Constitutional:      Appearance: Normal appearance.  HENT:     Head: Normocephalic and atraumatic.     Right Ear: Tympanic membrane normal.     Left Ear: Tympanic membrane normal.     Nose: Nose normal.     Mouth/Throat:     Mouth: Mucous membranes are moist.  Eyes:     Extraocular Movements: Extraocular movements intact.     Pupils: Pupils are equal, round, and reactive to light.  Cardiovascular:     Rate and Rhythm: Normal rate.     Pulses: Normal pulses.  Pulmonary:     Effort: Pulmonary effort is normal.  Musculoskeletal:        General: Normal range of motion.     Cervical back: Normal range of motion and neck supple.  Neurological:     General: No focal deficit present.     Mental Status: He is alert and oriented to person, place, and time.  Psychiatric:        Thought Content: Thought content normal.     Mental  Status Exam: General Appearance: Casual and Disheveled  Orientation:  Full (Time, Place, and Person)  Memory:  Immediate;   Fair Recent;   Fair Remote;   Fair  Concentration:  Concentration: Fair and Attention Span: Fair  Recall:  Fair  Attention  Fair  Eye Contact:  Fair  Speech:  Clear and Coherent and Normal Rate  Language:  Fair  Volume:  Normal  Mood: WNL  Affect:  Congruent  Thought Process:  Coherent  Thought Content:  WDL  Suicidal Thoughts:  No  Homicidal Thoughts:  No  Judgement:  Fair  Insight:  Fair  Psychomotor Activity:  Normal  Akathisia:  NA  Fund of Knowledge:  Fair      Assets:  Manufacturing systems engineer Desire for Improvement Intimacy Social Support  Cognition:  WNL  ADL's:  Intact  AIMS (if indicated):        Other History   These have been pulled in through the EMR, reviewed, and updated if appropriate.  Family History:  The patient's family history includes Diabetes in his mother; Hypertension in his father and mother.  Medical History: Past Medical History:  Diagnosis Date   Fentanyl  use disorder, severe (HCC)    Hepatitis C    Heroin abuse (HCC)    IVDU (intravenous drug user)    MRSA infection    Opioid overdose (HCC)    Overdose 07/08/2014   Substance abuse (HCC)     Surgical History: Past Surgical History:  Procedure Laterality Date   APPENDECTOMY     I & D EXTREMITY Right 11/17/2021   Procedure: RIGHT HAND IRRIGATION AND DEBRIDEMENT;  Surgeon: Brunilda Capra, MD;  Location: [facility] OR;  Service: Orthopedics;  Laterality: Right;     Medications:   Current Facility-Administered Medications:    acetaminophen  (TYLENOL ) tablet 650 mg, 650 mg, Oral, Q6H PRN, 650 mg at 11/18/23 1818 **OR** acetaminophen  (TYLENOL ) suppository 650 mg, 650 mg, Rectal, Q6H PRN, Cox, Amy N, DO   benzocaine  (ORAJEL) 10 % mucosal gel, , Mouth/Throat, TID PRN, Ree Candy, MD, Given at 11/19/23 1643   heparin  injection 5,000 Units, 5,000 Units, Subcutaneous,  Q8H, Cox, Amy N, DO, 5,000 Units at 11/19/23 1509   LORazepam  (ATIVAN ) injection 1 mg, 1 mg, Intravenous, Q4H PRN, Ree Candy, MD   methadone  (DOLOPHINE ) tablet 5 mg, 5 mg, Oral, Q4H PRN, Cox, Amy N, DO   morphine  (PF) 2 MG/ML injection 2 mg, 2 mg, Intravenous, Q4H PRN, 2 mg at 11/19/23 1640 **OR** morphine  (PF) 4 MG/ML injection 4 mg, 4 mg, Intravenous, Q4H PRN, Ree Candy, MD   nicotine  (NICODERM CQ  - dosed in mg/24 hours) patch 21 mg, 21 mg, Transdermal, Daily PRN, Cox, Amy N, DO   ondansetron  (ZOFRAN ) tablet 4 mg, 4 mg, Oral, Q6H PRN **OR** ondansetron  (ZOFRAN ) injection 4 mg, 4 mg, Intravenous, Q6H PRN, Cox, Amy N, DO, 4 mg at 11/19/23 1640  Allergies: No Known Allergies  Elston Halsted, NP

## 2023-11-19 NOTE — TOC Initial Note (Signed)
 Transition of Care [facility]) - Progression Note    Patient Details  Name: Samuel Weber MRN: [id number] Date of Birth: 1985-12-24  Transition of Care [facility] CM/SW Contact  Baird Bombard, RN Phone Number: 11/19/2023, 12:06 PM  Clinical Narrative:    Spoke with patient to inquire about if he wanted substance abuse resources. Patient stated he still has the resources given to him previously.   Patient requested to speak with the pharmacist.          Expected Discharge Plan and Services                                               Social Determinants of Health (SDOH) Interventions SDOH Screenings   Food Insecurity: No Food Insecurity (11/18/2023)  Housing: Low Risk  (11/18/2023)  Transportation Needs: No Transportation Needs (11/18/2023)  Utilities: Not At Risk (11/18/2023)  Tobacco Use: Medium Risk (11/18/2023)    Readmission Risk Interventions     No data to display

## 2023-11-19 NOTE — Progress Notes (Addendum)
 PROGRESS NOTE  Samuel Weber    DOB: [birth year], 38 y.o.  XBJ:[id number]    Code Status: Full Code   DOA: 11/18/2023   LOS: 1   Brief hospital course  Samuel Weber is a 38 y.o. male with a PMH significant for IV drug use, polysubstance abuse, history of opioid withdrawal, tobacco abuse, drug overdose, HCV, history of MRSA, who presents emergency department for chief concerns of intractable nausea, vomiting.   Vitals in the ED showed T of 98.8, rr of 19, hr 57, blood pressure 146/70, SpO2 100% on room air. Serum sodium is 142, potassium 2.9, chloride 110, bicarb 19, BUN of 12, serum creatinine 0.94, EGFR greater than 60, nonfasting blood glucose 108, WBC 10.0, hemoglobin 14.5, platelets of 396. HS troponin 9   ED treatment: Morphine  4 mg IV one-time dose, ondansetron  4 mg IV one-time dose, Protonix  40 mg IV one-time dose, Compazine  5 milligrams IV one-time dose, potassium chloride  10 mill equivalent IV, one-time dose per EDP, LR 1 L bolus.  Patient was admitted to medicine service for further workup and management of opioid withdrawal as outlined in detail below.  11/19/23 -symptomatically improved. Nausea without vomiting this am but unable to tolerate diet. Does not wish to use suboxone  nor methadone   Assessment & Plan  Principal Problem:   Intractable nausea and vomiting Active Problems:   IVDU (intravenous drug user)   Polysubstance abuse (HCC)   Hypokalemia   Opioid withdrawal (HCC)   Tobacco abuse   Hypophosphatemia  Intractable nausea and vomiting- perhaps has viral gastroenteritis but patient is adamant symptoms are solely from withdrawal  - continue IV fluids while unable to tolerate PO - antiemetics PRN   Opioid withdrawal (HCC) Methadone 5 mg p.o. every 4 hours as needed for withdrawal  Morphine  and ativan  PRN - psychiatry consulted for recommendations on addiction treatment. Patient desires to abstain and has had bad experience with suboxone  being started too  early in the past so does not want to try this again.  - continue COWs monitoring   Hypokalemia- resolved - monitor    Tobacco use As needed nicotine  patch ordered   Hypophosphatemia- resolved  Body mass index is 23.09 kg/m.  VTE ppx: heparin  injection 5,000 Units Start: 11/19/23 0600 Place TED hose Start: 11/18/23 1428  Diet:     Diet   Diet full liquid Room service appropriate? Yes; Fluid consistency: Thin   Consultants: Psych   Subjective 11/19/23    Pt reports feeling improved. Not able to tolerate PO water. Nausea present without vomiting or diarrhea.    Objective   Blood pressure (!) 150/90, pulse 77, temperature 98.8 F (37.1 C), resp. rate 17, height 6\' 1"  (1.854 m), weight 79.4 kg, SpO2 100%.  Intake/Output Summary (Last 24 hours) at 11/19/2023 0737 Last data filed at 11/19/2023 0540 Gross per 24 hour  Intake 2809.73 ml  Output --  Net 2809.73 ml   Filed Weights   11/18/23 1113  Weight: 79.4 kg    Physical Exam:  General: awake, alert, NAD HEENT: atraumatic, clear conjunctiva, anicteric sclera, MMM, hearing grossly normal Respiratory: normal respiratory effort. Cardiovascular: quick capillary refill, normal S1/S2, RRR, no JVD, murmurs Gastrointestinal: soft, NT, ND Nervous: A&O x3. no gross focal neurologic deficits, normal speech Extremities: moves all equally, no edema, normal tone Skin: dry, intact, normal temperature, normal color. No rashes, lesions or ulcers on exposed skin Psychiatry: normal mood, congruent affect  Labs   I have personally reviewed the following labs  and imaging studies CBC    Component Value Date/Time   WBC 12.8 (H) 11/19/2023 0409   RBC 4.37 11/19/2023 0409   HGB 13.6 11/19/2023 0409   HCT 39.2 11/19/2023 0409   PLT 144 (L) 11/19/2023 0409   MCV 89.7 11/19/2023 0409   MCH 31.1 11/19/2023 0409   MCHC 34.7 11/19/2023 0409   RDW 13.2 11/19/2023 0409   LYMPHSABS 2.6 10/17/2023 0550   MONOABS 0.7 10/17/2023 0550    EOSABS 0.1 10/17/2023 0550   BASOSABS 0.0 10/17/2023 0550      Latest Ref Rng & Units 11/19/2023    4:09 AM 11/18/2023   11:41 AM 10/17/2023    5:50 AM  BMP  Glucose 70 - 99 mg/dL 79  409  88   BUN 6 - 20 mg/dL 7  12  17    Creatinine 0.61 - 1.24 mg/dL 8.11  9.14  7.82   Sodium 135 - 145 mmol/L 140  142  143   Potassium 3.5 - 5.1 mmol/L 4.8  2.9  3.2   Chloride 98 - 111 mmol/L 110  110  111   CO2 22 - 32 mmol/L 20  19  23    Calcium 8.9 - 10.3 mg/dL 8.9  9.3  8.9     CT ABDOMEN PELVIS W CONTRAST Result Date: 11/18/2023 CLINICAL DATA:  Upper and right-sided abdominal pain.  Vomiting. EXAM: CT ABDOMEN AND PELVIS WITH CONTRAST TECHNIQUE: Multidetector CT imaging of the abdomen and pelvis was performed using the standard protocol following bolus administration of intravenous contrast. RADIATION DOSE REDUCTION: This exam was performed according to the departmental dose-optimization program which includes automated exposure control, adjustment of the mA and/or kV according to patient size and/or use of iterative reconstruction technique. CONTRAST:  OMNIPAQUE  IOHEXOL  300 MG/ML  SOLN COMPARISON:  Most recent CT 10/13/2023 FINDINGS: Lower chest: Clear lung bases. Minimal wall thickening of the distal esophagus. Hepatobiliary: Focal fatty infiltration adjacent to the falciform ligament. Scattered tiny hepatic hypodensities, unchanged, too small to characterize but likely small cysts. No suspicious liver lesion. Gallbladder physiologically distended, no calcified stone. No biliary dilatation. Pancreas: No ductal dilatation or inflammation. Spleen: Normal in size without focal abnormality. Adrenals/Urinary Tract: No adrenal nodule. No hydronephrosis. No visible renal calculi. No evidence of renal inflammation. Tiny hypodensities in the kidneys, too small to characterize. No further follow-up imaging is recommended. Unremarkable urinary bladder, no bladder wall thickening. Stomach/Bowel: Minimal wall  thickening of the distal esophagus. No evidence of gastric wall thickening or inflammation. No small bowel obstruction or inflammatory change. Scattered fluid within nondilated small bowel in the lower abdomen. Appendectomy per history. Small to moderate volume of stool in the colon. No colonic wall thickening. Vascular/Lymphatic: No acute vascular findings. Normal caliber abdominal aorta. Patent portal vein. No abdominopelvic adenopathy. Reproductive: Prostate is unremarkable. Other: No free air, free fluid, or intra-abdominal fluid collection. No abdominal wall hernia. Musculoskeletal: There are no acute or suspicious osseous abnormalities. Mild scoliotic curvature of the spine. IMPRESSION: 1. Minimal wall thickening of the distal esophagus, can be seen with esophagitis. 2. Scattered fluid within nondilated small bowel in the lower abdomen, nonspecific. No bowel obstruction or inflammation. Electronically Signed   By: Chadwick Colonel M.D.   On: 11/18/2023 13:59    Disposition Plan & Communication  Patient status: Inpatient  Admitted From: Home Planned disposition location: Home Anticipated discharge date: TBD pending withdrawal treatment   Family Communication: none at bedside    Author: Ree Candy, DO [facility] 11/19/2023,  7:37 AM   Available by Epic secure chat 7AM-7PM. If 7PM-7AM, please contact night-coverage.  [facility] contact information found on [email or web address].

## 2023-11-19 NOTE — Plan of Care (Signed)
  Problem: Education: Goal: Knowledge of General Education information will improve Description: Including pain rating scale, medication(s)/side effects and non-pharmacologic comfort measures Outcome: Progressing   Problem: Clinical Measurements: Goal: Will remain free from infection Outcome: Progressing   Problem: Clinical Measurements: Goal: Cardiovascular complication will be avoided Outcome: Progressing   Problem: Coping: Goal: Level of anxiety will decrease Outcome: Progressing   Problem: Skin Integrity: Goal: Risk for impaired skin integrity will decrease Outcome: Progressing

## 2023-11-20 DIAGNOSIS — F1990 Other psychoactive substance use, unspecified, uncomplicated: Secondary | ICD-10-CM | POA: Diagnosis not present

## 2023-11-20 DIAGNOSIS — F1193 Opioid use, unspecified with withdrawal: Secondary | ICD-10-CM

## 2023-11-20 DIAGNOSIS — E876 Hypokalemia: Secondary | ICD-10-CM | POA: Diagnosis not present

## 2023-11-20 DIAGNOSIS — R112 Nausea with vomiting, unspecified: Secondary | ICD-10-CM | POA: Diagnosis not present

## 2023-11-20 DIAGNOSIS — Z720 Tobacco use: Secondary | ICD-10-CM

## 2023-11-20 LAB — CBC
HCT: 44.9 % (ref 39.0–52.0)
Hemoglobin: 15.4 g/dL (ref 13.0–17.0)
MCH: 31 pg (ref 26.0–34.0)
MCHC: 34.3 g/dL (ref 30.0–36.0)
MCV: 90.3 fL (ref 80.0–100.0)
Platelets: 221 10*3/uL (ref 150–400)
RBC: 4.97 MIL/uL (ref 4.22–5.81)
RDW: 13.2 % (ref 11.5–15.5)
WBC: 6.5 10*3/uL (ref 4.0–10.5)
nRBC: 0 % (ref 0.0–0.2)

## 2023-11-20 MED ORDER — CLONIDINE HCL 0.1 MG PO TABS
0.1000 mg | ORAL_TABLET | Freq: Every day | ORAL | Status: DC
Start: 2023-11-25 — End: 2023-11-24

## 2023-11-20 MED ORDER — CLONIDINE HCL 0.1 MG PO TABS
0.1000 mg | ORAL_TABLET | Freq: Four times a day (QID) | ORAL | Status: AC
  Administered 2023-11-20 – 2023-11-22 (×8): 0.1 mg via ORAL
  Filled 2023-11-20 (×8): qty 1

## 2023-11-20 MED ORDER — CLONIDINE HCL 0.1 MG PO TABS
0.1000 mg | ORAL_TABLET | ORAL | Status: AC
  Administered 2023-11-22 – 2023-11-24 (×4): 0.1 mg via ORAL
  Filled 2023-11-20 (×4): qty 1

## 2023-11-20 NOTE — Plan of Care (Signed)
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
## 2023-11-20 NOTE — Progress Notes (Signed)
 PROGRESS NOTE    Samuel Weber  ZOX:[id number] DOB: [birth year] DOA: 11/18/2023 PCP: Pcp, No  Chief Complaint  Patient presents with   Chest Pain   Emesis    Hospital Course:  Samuel Weber is a 38 year old male with history of IV drug abuse, HCV, drug overdose, tobacco abuse, MRSA, and multiple prior admissions for opioid withdrawal.  He presents on this admission with nausea vomiting.  Patient was admitted for medication assisted opioid withdrawal.  Subjective: This morning patient reports that he is feeling unwell.  He endorses increasing anxiety.  Reports that he had a panic attack overnight.   Objective: Vitals:   11/20/23 0723 11/20/23 0921 11/20/23 1154 11/20/23 1331  BP: (!) 150/90  (!) 153/93   Pulse: 72 96 85 87  Resp: 18  18   Temp: 98.6 F (37 C)  97.9 F (36.6 C)   TempSrc:      SpO2: 99%  97%   Weight:      Height:        Intake/Output Summary (Last 24 hours) at 11/20/2023 1720 Last data filed at 11/20/2023 1422 Gross per 24 hour  Intake 480 ml  Output --  Net 480 ml   Filed Weights   11/18/23 1113  Weight: 79.4 kg    Examination: General exam: Appears calm and comfortable, NAD  Respiratory system: No work of breathing, symmetric chest wall expansion Cardiovascular system: S1 & S2 heard, RRR.  Gastrointestinal system: Abdomen is nondistended, soft and nontender.  Neuro: Alert and oriented. No focal neurological deficits. Extremities: Symmetric, expected ROM Skin: No rashes, lesions Psychiatry: Demonstrates appropriate judgement and insight. Mood & affect appropriate for situation.   Assessment & Plan:  Principal Problem:   Intractable nausea and vomiting Active Problems:   IVDU (intravenous drug user)   Polysubstance abuse (HCC)   Hypokalemia   Opioid withdrawal (HCC)   Tobacco abuse   Hypophosphatemia    Intractable nausea and vomiting - Appears to be resolving - Likely secondary to opioid withdrawal although cannot rule out viral  gastroenteritis - IV fluids as needed. - Symptomatic support with antiemetics  Opioid withdrawal - Was on methadone, morphine , Ativan . - Psychiatry has been consulted and recommended clonidine  withdrawal protocol.  This has been initiated today - Psychiatry has also recommended inpatient substance abuse treatment, patient is not interested in pursuing this at this time - Continue close monitoring - Daily CMP, correct electrolytes as needed  Hypokalemia - Replace as needed  Tobacco abuse - Nicotine  patch  Hypophosphatemia - Monitor, replace as needed  Polysubstance abuse - Patient has been counseled on cessation   DVT prophylaxis: Heparin    Code Status: Full Code Disposition:  On clonidine  for withdrawal, eventually DC home.  Consultants:    Procedures:    Antimicrobials:  Anti-infectives (From admission, onward)    None       Data Reviewed: I have personally reviewed following labs and imaging studies CBC: Recent Labs  Lab 11/18/23 1141 11/19/23 0409 11/20/23 1028  WBC 10.0 12.8* 6.5  HGB 14.5 13.6 15.4  HCT 41.1 39.2 44.9  MCV 89.7 89.7 90.3  PLT 396 144* 221   Basic Metabolic Panel: Recent Labs  Lab 11/18/23 1141 11/19/23 0409  NA 142 140  K 2.9* 4.8  CL 110 110  CO2 19* 20*  GLUCOSE 108* 79  BUN 12 7  CREATININE 0.94 0.84  CALCIUM 9.3 8.9  MG 2.3  --   PHOS <1.0* 4.4   GFR: Estimated Creatinine  Clearance: 136.5 mL/min (by C-G formula based on SCr of 0.84 mg/dL). Liver Function Tests: Recent Labs  Lab 11/18/23 1206  AST 33  ALT 16  ALKPHOS 70  BILITOT 1.3*  PROT 7.3  ALBUMIN 4.0   CBG: No results for input(s): "GLUCAP" in the last 168 hours.  No results found for this or any previous visit (from the past 240 hours).   Radiology Studies: No results found.  Scheduled Meds:  cloNIDine   0.1 mg Oral QID   Followed by   Samuel Weber ON 11/22/2023] cloNIDine   0.1 mg Oral BH-qamhs   Followed by   Samuel Weber ON 11/25/2023] cloNIDine   0.1 mg  Oral QAC breakfast   heparin   5,000 Units Subcutaneous Q8H   Continuous Infusions:   LOS: 2 days  MDM: Patient is high risk for one or more organ failure.  They necessitate ongoing hospitalization for continued IV therapies and subsequent lab monitoring. Total time spent interpreting labs and vitals, reviewing the medical record, coordinating care amongst consultants and care team members, directly assessing and discussing care with the patient and/or family: 55 min  Samuel Forstner, DO [facility]  To contact the attending physician between 7A-7P please use Epic Chat. To contact the covering physician during after hours 7P-7A, please review Amion.  11/20/2023, 5:20 PM   *This document has been created with the assistance of dictation software. Please excuse typographical errors. *

## 2023-11-21 DIAGNOSIS — R112 Nausea with vomiting, unspecified: Secondary | ICD-10-CM | POA: Diagnosis not present

## 2023-11-21 DIAGNOSIS — F1990 Other psychoactive substance use, unspecified, uncomplicated: Secondary | ICD-10-CM

## 2023-11-21 DIAGNOSIS — F1193 Opioid use, unspecified with withdrawal: Secondary | ICD-10-CM | POA: Diagnosis not present

## 2023-11-21 DIAGNOSIS — Z720 Tobacco use: Secondary | ICD-10-CM

## 2023-11-21 DIAGNOSIS — E876 Hypokalemia: Secondary | ICD-10-CM

## 2023-11-21 MED ORDER — ENSURE PLUS HIGH PROTEIN PO LIQD
237.0000 mL | Freq: Two times a day (BID) | ORAL | Status: DC
  Administered 2023-11-21 – 2023-11-23 (×6): 237 mL via ORAL

## 2023-11-21 NOTE — Plan of Care (Signed)
  Problem: Health Behavior/Discharge Planning: Goal: Ability to manage health-related needs will improve Outcome: Progressing   Problem: Activity: Goal: Risk for activity intolerance will decrease Outcome: Progressing   Problem: Nutrition: Goal: Adequate nutrition will be maintained Outcome: Progressing   Problem: Coping: Goal: Level of anxiety will decrease Outcome: Progressing   Problem: Pain Managment: Goal: General experience of comfort will improve and/or be controlled Outcome: Progressing   Problem: Safety: Goal: Ability to remain free from injury will improve Outcome: Progressing

## 2023-11-21 NOTE — Progress Notes (Signed)
 PROGRESS NOTE    [name]  HYQ:[id number] DOB: Mar 18, 1986 DOA: 11/18/2023 PCP: Pcp, No  Chief Complaint  Patient presents with   Chest Pain   Emesis    Hospital Course:  Samuel Weber is a 38 year old male with history of IV drug abuse, HCV, drug overdose, tobacco abuse, MRSA, and multiple prior admissions for opioid withdrawal.  He presents on this admission with nausea vomiting.  Patient was admitted for medication assisted opioid withdrawal.  Subjective: This morning patient reports overall body aches.  Denies any diarrhea.  Denies vomiting.   We discussed his refusal to pursue outpatient substance abuse treatment.  He reports he cannot receive inpatient treatment at this time as he has an upcoming court date.  He believes he will pursue more aggressive substance abuse treatment once he has handled his legal battles.  Objective: Vitals:   11/21/23 0427 11/21/23 0726 11/21/23 1103 11/21/23 1506  BP: 119/85 128/83 (!) 158/90 139/79  Pulse: (!) 45 (!) 57 (!) 56 65  Resp: 18 18 14 16   Temp: 97.7 F (36.5 C) 97.9 F (36.6 C) 98.4 F (36.9 C) 98.4 F (36.9 C)  TempSrc: Oral     SpO2: 99% 97% 99% 99%  Weight:      Height:        Intake/Output Summary (Last 24 hours) at 11/21/2023 1608 Last data filed at 11/21/2023 1405 Gross per 24 hour  Intake 0 ml  Output --  Net 0 ml   Filed Weights   11/18/23 1113  Weight: 79.4 kg    Examination: General exam: Peers uncomfortable, mildly diaphoretic Respiratory system: No work of breathing, symmetric chest wall expansion Cardiovascular system: S1 & S2 heard, RRR.  Gastrointestinal system: Abdomen is nondistended, soft and nontender.  Neuro: Alert and oriented. No focal neurological deficits. Extremities: Symmetric, expected ROM Skin: No rashes, lesions Psychiatry: Demonstrates appropriate judgement and insight. Mood & affect appropriate for situation.   Assessment & Plan:  Principal Problem:   Intractable nausea and  vomiting Active Problems:   IVDU (intravenous drug user)   Polysubstance abuse (HCC)   Hypokalemia   Opioid withdrawal (HCC)   Tobacco abuse   Hypophosphatemia    Intractable nausea and vomiting - Resolved - Secondary to opioid withdrawal, cannot rule out viral gastroenteritis - IV fluids as needed - Encourage p.o. intake as tolerated - Continue symptomatic support with antiemetics  Opioid withdrawal - Was on methadone , morphine , Ativan . - Psychiatry has been consulted and recommended clonidine  withdrawal protocol. - Clonidine  withdrawal protocol initiated 5/28, will complete. BP tolerating. - Psychiatry is recommended inpatient substance abuse treatment, patient is not interested in pursuing this at this time.  He will continue to consider it - Continue close monitoring.  Vital signs currently stable - Daily CMP, correct electrolytes as needed  Hypokalemia - Trend CMP.  Replace as needed  Tobacco abuse - Nicotine  patch  Hypophosphatemia - Monitor, replace as needed  Polysubstance abuse - Patient has been counseled on cessation - Psychiatry consult as above   DVT prophylaxis: Heparin    Code Status: Full Code Disposition:  On clonidine  for withdrawal, eventually DC home.  Consultants:    Procedures:    Antimicrobials:  Anti-infectives (From admission, onward)    None       Data Reviewed: I have personally reviewed following labs and imaging studies CBC: Recent Labs  Lab 11/18/23 1141 11/19/23 0409 11/20/23 1028  WBC 10.0 12.8* 6.5  HGB 14.5 13.6 15.4  HCT 41.1 39.2 44.9  MCV 89.7 89.7 90.3  PLT 396 144* 221   Basic Metabolic Panel: Recent Labs  Lab 11/18/23 1141 11/19/23 0409  NA 142 140  K 2.9* 4.8  CL 110 110  CO2 19* 20*  GLUCOSE 108* 79  BUN 12 7  CREATININE 0.94 0.84  CALCIUM 9.3 8.9  MG 2.3  --   PHOS <1.0* 4.4   GFR: Estimated Creatinine Clearance: 136.5 mL/min (by C-G formula based on SCr of 0.84 mg/dL). Liver Function  Tests: Recent Labs  Lab 11/18/23 1206  AST 33  ALT 16  ALKPHOS 70  BILITOT 1.3*  PROT 7.3  ALBUMIN 4.0   CBG: No results for input(s): "GLUCAP" in the last 168 hours.  No results found for this or any previous visit (from the past 240 hours).   Radiology Studies: No results found.  Scheduled Meds:  cloNIDine   0.1 mg Oral QID   Followed by   Cecily Cohen ON 11/22/2023] cloNIDine   0.1 mg Oral BH-qamhs   Followed by   Cecily Cohen ON 11/25/2023] cloNIDine   0.1 mg Oral QAC breakfast   feeding supplement  237 mL Oral BID BM   heparin   5,000 Units Subcutaneous Q8H   Continuous Infusions:   LOS: 3 days  MDM: Patient is high risk for one or more organ failure.  They necessitate ongoing hospitalization for continued IV therapies and subsequent lab monitoring. Total time spent interpreting labs and vitals, reviewing the medical record, coordinating care amongst consultants and care team members, directly assessing and discussing care with the patient and/or family: 55 min  Jubilee Vivero, DO [facility]  To contact the attending physician between 7A-7P please use Epic Chat. To contact the covering physician during after hours 7P-7A, please review Amion.  11/21/2023, 4:08 PM   *This document has been created with the assistance of dictation software. Please excuse typographical errors. *

## 2023-11-22 DIAGNOSIS — F1193 Opioid use, unspecified with withdrawal: Secondary | ICD-10-CM | POA: Diagnosis not present

## 2023-11-22 DIAGNOSIS — R112 Nausea with vomiting, unspecified: Secondary | ICD-10-CM | POA: Diagnosis not present

## 2023-11-22 LAB — CBC WITH DIFFERENTIAL/PLATELET
Abs Immature Granulocytes: 0.04 10*3/uL (ref 0.00–0.07)
Basophils Absolute: 0 10*3/uL (ref 0.0–0.1)
Basophils Relative: 0 %
Eosinophils Absolute: 0.1 10*3/uL (ref 0.0–0.5)
Eosinophils Relative: 1 %
HCT: 37.4 % — ABNORMAL LOW (ref 39.0–52.0)
Hemoglobin: 13.1 g/dL (ref 13.0–17.0)
Immature Granulocytes: 1 %
Lymphocytes Relative: 55 %
Lymphs Abs: 3.3 10*3/uL (ref 0.7–4.0)
MCH: 31.9 pg (ref 26.0–34.0)
MCHC: 35 g/dL (ref 30.0–36.0)
MCV: 91 fL (ref 80.0–100.0)
Monocytes Absolute: 0.5 10*3/uL (ref 0.1–1.0)
Monocytes Relative: 9 %
Neutro Abs: 2 10*3/uL (ref 1.7–7.7)
Neutrophils Relative %: 34 %
Platelets: 249 10*3/uL (ref 150–400)
RBC: 4.11 MIL/uL — ABNORMAL LOW (ref 4.22–5.81)
RDW: 13.2 % (ref 11.5–15.5)
WBC: 5.9 10*3/uL (ref 4.0–10.5)
nRBC: 0 % (ref 0.0–0.2)

## 2023-11-22 LAB — COMPREHENSIVE METABOLIC PANEL WITH GFR
ALT: 19 U/L (ref 0–44)
AST: 26 U/L (ref 15–41)
Albumin: 3.3 g/dL — ABNORMAL LOW (ref 3.5–5.0)
Alkaline Phosphatase: 51 U/L (ref 38–126)
Anion gap: 9 (ref 5–15)
BUN: 18 mg/dL (ref 6–20)
CO2: 23 mmol/L (ref 22–32)
Calcium: 8.9 mg/dL (ref 8.9–10.3)
Chloride: 108 mmol/L (ref 98–111)
Creatinine, Ser: 0.81 mg/dL (ref 0.61–1.24)
GFR, Estimated: >60 mL/min (ref >60)
Glucose, Bld: 101 mg/dL — ABNORMAL HIGH (ref 70–99)
Potassium: 3.8 mmol/L (ref 3.5–5.1)
Sodium: 140 mmol/L (ref 135–145)
Total Bilirubin: 0.7 mg/dL (ref 0.0–1.2)
Total Protein: 6.1 g/dL — ABNORMAL LOW (ref 6.5–8.1)

## 2023-11-22 LAB — PHOSPHORUS: Phosphorus: 4.8 mg/dL — ABNORMAL HIGH (ref 2.5–4.6)

## 2023-11-22 LAB — MAGNESIUM: Magnesium: 2 mg/dL (ref 1.7–2.4)

## 2023-11-22 MED ORDER — METOCLOPRAMIDE HCL 5 MG/ML IJ SOLN
10.0000 mg | Freq: Three times a day (TID) | INTRAMUSCULAR | Status: DC | PRN
  Administered 2023-11-22 – 2023-11-24 (×5): 10 mg via INTRAVENOUS
  Filled 2023-11-22 (×5): qty 2

## 2023-11-22 NOTE — Progress Notes (Signed)
 PROGRESS NOTE    Samuel Weber  RKY:[id number] DOB: 1985/09/02 DOA: 11/18/2023 PCP: Pcp, No  Chief Complaint  Patient presents with   Chest Pain   Emesis    Hospital Course:  Samuel Weber is a 38 year old male with history of IV drug abuse, HCV, drug overdose, tobacco abuse, MRSA, and multiple prior admissions for opioid withdrawal.  He presents on this admission with nausea vomiting.  Patient was admitted for medication assisted opioid withdrawal.  Subjective: No acute events overnight.  Patient reports that he is starting to feel worse today.  He is endorsing frequent trips to the bathroom though he denies any diarrhea.  Worsening nausea.  Objective: Vitals:   11/22/23 0726 11/22/23 1028 11/22/23 1510 11/22/23 1727  BP: 97/64 117/65 106/68 93/61  Pulse: 70 62 (!) 46 63  Resp: 17 18 18    Temp: 98.9 F (37.2 C) 98.4 F (36.9 C) 97.9 F (36.6 C)   TempSrc:      SpO2: 98% 97% 96% 98%  Weight:      Height:        Intake/Output Summary (Last 24 hours) at 11/22/2023 1740 Last data filed at 11/22/2023 1048 Gross per 24 hour  Intake 850 ml  Output --  Net 850 ml   Filed Weights   11/18/23 1113  Weight: 79.4 kg    Examination: General exam: Appears calm and comfortable, NAD  Respiratory system: No work of breathing, symmetric chest wall expansion Cardiovascular system: S1 & S2 heard, RRR.  Gastrointestinal system: Abdomen is nondistended, soft and nontender.  Neuro: Alert and oriented. No focal neurological deficits. Extremities: Symmetric, expected ROM Skin: No rashes, lesions Psychiatry: Demonstrates appropriate judgement and insight. Mood & affect appropriate for situation.   Assessment & Plan:  Principal Problem:   Intractable nausea and vomiting Active Problems:   IVDU (intravenous drug user)   Polysubstance abuse (HCC)   Hypokalemia   Opioid withdrawal (HCC)   Tobacco abuse   Hypophosphatemia    Intractable nausea and vomiting - Continue with as  needed Zofran  and Reglan  - Still tolerating p.o. - No indication for IV fluids at this time - Likely secondary to opioid withdrawal although cannot rule out viral gastroenteritis  Opioid withdrawal - Was on methadone, morphine , Ativan . - Currently on clonidine  withdrawal protocol.  Monitor closely. - Blood pressure trending down but remains stable. - Continue trending CMP, correct electrolytes as needed -- Per Psychiatry patient to follow up with: [facility]; [address]. Arvie Weber, [phone]. He can walk in [place] or Friday 8AM to 3PM.   Hypokalemia - Replace as needed  Tobacco abuse - Nicotine  patch  Hypophosphatemia - Monitor, replace as needed  Polysubstance abuse - Patient has been counseled on cessation   DVT prophylaxis: Heparin    Code Status: Full Code Disposition:  On clonidine  for withdrawal, eventually DC home.  Consultants:    Procedures:    Antimicrobials:  Anti-infectives (From admission, onward)    None       Data Reviewed: I have personally reviewed following labs and imaging studies CBC: Recent Labs  Lab 11/18/23 1141 11/19/23 0409 11/20/23 1028 11/22/23 0523  WBC 10.0 12.8* 6.5 5.9  NEUTROABS  --   --   --  2.0  HGB 14.5 13.6 15.4 13.1  HCT 41.1 39.2 44.9 37.4*  MCV 89.7 89.7 90.3 91.0  PLT 396 144* 221 249   Basic Metabolic Panel: Recent Labs  Lab 11/18/23 1141 11/19/23 0409 11/22/23 0523  NA 142 140  140  K 2.9* 4.8 3.8  CL 110 110 108  CO2 19* 20* 23  GLUCOSE 108* 79 101*  BUN 12 7 18   CREATININE 0.94 0.84 0.81  CALCIUM 9.3 8.9 8.9  MG 2.3  --  2.0  PHOS <1.0* 4.4 4.8*   GFR: Estimated Creatinine Clearance: 141.6 mL/min (by C-G formula based on SCr of 0.81 mg/dL). Liver Function Tests: Recent Labs  Lab 11/18/23 1206 11/22/23 0523  AST 33 26  ALT 16 19  ALKPHOS 70 51  BILITOT 1.3* 0.7  PROT 7.3 6.1*  ALBUMIN 4.0 3.3*   CBG: No results for input(s): "GLUCAP" in the last 168  hours.  No results found for this or any previous visit (from the past 240 hours).   Radiology Studies: No results found.  Scheduled Meds:  cloNIDine   0.1 mg Oral BH-qamhs   Followed by   Cecily Cohen ON 11/25/2023] cloNIDine   0.1 mg Oral QAC breakfast   feeding supplement  237 mL Oral BID BM   heparin   5,000 Units Subcutaneous Q8H   Continuous Infusions:   LOS: 4 days  MDM: Patient is high risk for one or more organ failure.  They necessitate ongoing hospitalization for continued IV therapies and subsequent lab monitoring. Total time spent interpreting labs and vitals, reviewing the medical record, coordinating care amongst consultants and care team members, directly assessing and discussing care with the patient and/or family: 55 min  Latrecia Capito, DO [facility]  To contact the attending physician between 7A-7P please use Epic Chat. To contact the covering physician during after hours 7P-7A, please review Amion.  11/22/2023, 5:40 PM   *This document has been created with the assistance of dictation software. Please excuse typographical errors. *

## 2023-11-22 NOTE — Consult Note (Signed)
 [facility] Consult Follow Up  Patient Name: .Samuel Weber  MRN: [id number]  DOB: 19-Jan-1986  Consult Order details:  Orders (From admission, onward)     Start     Ordered   11/19/23 1233  IP CONSULT TO PSYCHIATRY       Ordering Provider: Ree Candy, MD  Provider:  (Not yet assigned)  Question Answer Comment  Location [facility]   Reason for Consult? addiction medicine, opioid withdrawal treatment      11/19/23 1232             Mode of Visit: In person    Psychiatry Consult Evaluation  Service Date: Nov 22, 2023 LOS:  LOS: 4 days  Chief Complaint " I want a life without drugs"   Primary Psychiatric Diagnoses  Opioid use disorder currently in withdrawls   Assessment  Samuel Weber is a 38 y.o. male admitted: Medicallyfor 11/18/2023 11:26 AM  opioid withdrawal symptoms. Aaron Aas He carries the psychiatric diagnoses of Polysubstance abuse disorder and has a past medical history of  Opioid dependence.His current presentation of withdrawals  is most consistent with his substance abuse . He meets criteria for SA treatment and rehab  based on his presenting symptoms and SA  history.  he reports that  current outpatient psychotropic medications include methadone  and Suboxone .  . He was not  compliant with medications prior to admission as evidenced by his withdrawal symptoms. On initial examination, patient  is calm and cooperative, expressing his desire for SA treatment and rehab.  He denies SI/HI/AVH. Please see plan below for detailed recommendations.  11/22/23: Patient reports doing fine and talks about his anxiety. He denies SI/HI/plan and denies hallucinations. He agrees to go to outpatient substance use programs and provider discussed to go to [facility] to get connected with mental health and substance use programs.  Diagnoses:  Active Hospital problems: Principal Problem:   Intractable nausea and vomiting Active Problems:    Tobacco abuse   IVDU (intravenous drug user)   Polysubstance abuse (HCC)   Hypokalemia   Opioid withdrawal (HCC)   Hypophosphatemia    Plan   ## Psychiatric Medication Recommendations:  Clonidine  detox protocol  ## Medical Decision Making Capacity: Not specifically addressed in this encounter  ## Further Work-up:  -- P [facility] for SA treatment when a bed is available EKG  -- Pertinent labwork reviewed earlier this admission includes: All labs reviewed   ## Disposition:--There is no psychiatric contraindication for discharge at this time ## Behavioral / Environmental: - No specific recommendations at this time.     ## Safety and Observation Level:  - Based on my clinical evaluation, I estimate the patient to be at low risk of self harm in the current setting. - At this time, we recommend  routine. This decision is based on my review of the chart including patient's history and current presentation, interview of the patient, mental status examination, and consideration of suicide risk including evaluating suicidal ideation, plan, intent, suicidal or self-harm behaviors, risk factors, and protective factors. This judgment is based on our ability to directly address suicide risk, implement suicide prevention strategies, and develop a safety plan while the patient is in the clinical setting. Please contact our team if there is a concern that risk level has changed.  CSSR Risk Category:C-SSRS RISK CATEGORY: No Risk  Suicide Risk Assessment: Patient has following modifiable risk factors for suicide: medication noncompliance, which we are addressing by recommending inpatient treatment.  Patient has following non-modifiable or demographic risk factors for suicide: male gender Patient has the following protective factors against suicide: Supportive family and Minor children in the home  Thank you for this consult request. Recommendations have been communicated to the primary team.  We will sign  off at this time.   Michal Callicott, MD       History of Present Illness    Chart review : Samuel Weber is a 39 year old male with history of IV drug use, polysubstance abuse, history of opioid withdrawal, tobacco abuse, drug overdose, HCV, history of MRSA, who presents emergency department for chief concerns of intractable nausea, vomiting.  His last use of fentanyl  and heroin IV was day before hospital admission. Per nursing, patient has been refusing Suboxone  and Methadone , reporting he does not like the way they make him feel. Psychiatry is consulted to evaluate and manage the withdrawls. 11/22/2023; patient reports feeling better but has some residual anxiety.  He reports fair appetite and sleep.  He continues to deny SI/HI/plan and continues to deny any hallucinations.  Provider and patient discussed about outpatient substance use programs and patient agrees to try those programs.  Patient is psychiatrically cleared and is at baseline.  Provided resources like [facility] where he can get established with both mental health and substance use programs.  He can also go to [facility] health facility based crisis in [place]. Psych ROS:  Depression: NA Anxiety:  NA Mania (lifetime and current): NA Psychosis: (lifetime and current): NA  Collateral information:  Contacted : NA   Review of Systems  Constitutional: Negative.   HENT: Negative.    Eyes: Negative.   Respiratory: Negative.    Cardiovascular: Negative.   Gastrointestinal: Negative.   Genitourinary: Negative.   Musculoskeletal: Negative.   Skin: Negative.   Neurological: Negative.   Endo/Heme/Allergies: Negative.   Psychiatric/Behavioral:  Positive for substance abuse.      Psychiatric and Social History  Psychiatric History:  Information collected from Patient/chart review/nursing  Prev Dx/Sx: Polysubstance abuse Current Psych Provider: NA Home Meds (current): Methadone /Suboxone  Previous Med Trials: NA Therapy:  NA  Prior Psych Hospitalization: Reported  Prior Self Harm: NA Prior Violence: NA  Family Psych History: Mom has Bipolar, manic Family Hx suicide: NA  Social History:  Developmental Hx: NA Educational Hx: NA Occupational Hx: NA Legal Hx: NA Living Situation: Lives with wife and kids Spiritual Hx: NA Access to weapons/lethal means: NA   Substance History Alcohol: NA  Type of alcohol NA Last Drink NA Number of drinks per day NA History of alcohol withdrawal seizures NA History of DT's NA Tobacco: Reported Illicit drugs: Reported Prescription drug abuse: NA Rehab hx: reported  Exam Findings  Physical Exam:  Vital Signs:  Temp:  [97.7 F (36.5 C)-98.9 F (37.2 C)] 97.9 F (36.6 C) (05/30 1510) Pulse Rate:  [46-75] 46 (05/30 1510) Resp:  [16-18] 18 (05/30 1510) BP: (86-134)/(51-91) 106/68 (05/30 1510) SpO2:  [95 %-99 %] 96 % (05/30 1510) Blood pressure 106/68, pulse (!) 46, temperature 97.9 F (36.6 C), resp. rate 18, height 6\' 1"  (1.854 m), weight 79.4 kg, SpO2 96%. Body mass index is 23.09 kg/m.  Physical Exam Vitals and nursing note reviewed.  Constitutional:      Appearance: Normal appearance.  HENT:     Head: Normocephalic and atraumatic.     Right Ear: Tympanic membrane normal.     Left Ear: Tympanic membrane normal.     Nose: Nose normal.     Mouth/Throat:  Mouth: Mucous membranes are moist.  Eyes:     Extraocular Movements: Extraocular movements intact.     Pupils: Pupils are equal, round, and reactive to light.  Cardiovascular:     Rate and Rhythm: Normal rate.     Pulses: Normal pulses.  Pulmonary:     Effort: Pulmonary effort is normal.  Musculoskeletal:        General: Normal range of motion.     Cervical back: Normal range of motion and neck supple.  Neurological:     General: No focal deficit present.     Mental Status: He is alert and oriented to person, place, and time.  Psychiatric:        Thought Content: Thought content normal.      Mental Status Exam: General Appearance: Casual and Disheveled  Orientation:  Full (Time, Place, and Person)  Memory:  Immediate;   Fair Recent;   Fair Remote;   Fair  Concentration:  Concentration: Fair and Attention Span: Fair  Recall:  Fair  Attention  Fair  Eye Contact:  Fair  Speech:  Clear and Coherent and Normal Rate  Language:  Fair  Volume:  Normal  Mood: WNL  Affect:  Congruent  Thought Process:  Coherent  Thought Content:  WDL  Suicidal Thoughts:  No  Homicidal Thoughts:  No  Judgement:  Fair  Insight:  Fair  Psychomotor Activity:  Normal  Akathisia:  NA  Fund of Knowledge:  Fair      Assets:  Manufacturing systems engineer Desire for Improvement Intimacy Social Support  Cognition:  WNL  ADL's:  Intact  AIMS (if indicated):        Other History   These have been pulled in through the EMR, reviewed, and updated if appropriate.  Family History:  The patient's family history includes Diabetes in his mother; Hypertension in his father and mother.  Medical History: Past Medical History:  Diagnosis Date   Fentanyl  use disorder, severe (HCC)    Hepatitis C    Heroin abuse (HCC)    IVDU (intravenous drug user)    MRSA infection    Opioid overdose (HCC)    Overdose 07/08/2014   Substance abuse (HCC)     Surgical History: Past Surgical History:  Procedure Laterality Date   APPENDECTOMY     I & D EXTREMITY Right 11/17/2021   Procedure: RIGHT HAND IRRIGATION AND DEBRIDEMENT;  Surgeon: Brunilda Capra, MD;  Location: [facility] OR;  Service: Orthopedics;  Laterality: Right;     Medications:   Current Facility-Administered Medications:    acetaminophen  (TYLENOL ) tablet 650 mg, 650 mg, Oral, Q6H PRN, 650 mg at 11/18/23 1818 **OR** acetaminophen  (TYLENOL ) suppository 650 mg, 650 mg, Rectal, Q6H PRN, Cox, Amy N, DO   benzocaine  (ORAJEL) 10 % mucosal gel, , Mouth/Throat, TID PRN, Ree Candy, MD, Given at 11/19/23 1643   [COMPLETED] cloNIDine  (CATAPRES ) tablet 0.1  mg, 0.1 mg, Oral, QID, 0.1 mg at 11/22/23 0905 **FOLLOWED BY** cloNIDine  (CATAPRES ) tablet 0.1 mg, 0.1 mg, Oral, BH-qamhs **FOLLOWED BY** [START ON 11/25/2023] cloNIDine  (CATAPRES ) tablet 0.1 mg, 0.1 mg, Oral, QAC breakfast, Dezii, Alexandra, DO   dicyclomine  (BENTYL ) tablet 20 mg, 20 mg, Oral, Q6H PRN, Byungura, Veronique M, NP, 20 mg at 11/21/23 1259   feeding supplement (ENSURE PLUS HIGH PROTEIN) liquid 237 mL, 237 mL, Oral, BID BM, Dezii, Alexandra, DO, 237 mL at 11/22/23 1441   heparin  injection 5,000 Units, 5,000 Units, Subcutaneous, Q8H, Cox, Amy N, DO, 5,000 Units at 11/22/23 1437  hydrOXYzine  (ATARAX ) tablet 25 mg, 25 mg, Oral, Q6H PRN, Byungura, Veronique M, NP, 25 mg at 11/22/23 0906   loperamide  (IMODIUM ) capsule 2-4 mg, 2-4 mg, Oral, PRN, Byungura, Veronique M, NP   LORazepam  (ATIVAN ) injection 1 mg, 1 mg, Intravenous, Q4H PRN, Ree Candy, MD, 1 mg at 11/21/23 1310   methocarbamol  (ROBAXIN ) tablet 500 mg, 500 mg, Oral, Q8H PRN, Byungura, Veronique M, NP, 500 mg at 11/22/23 0906   naproxen  (NAPROSYN ) tablet 500 mg, 500 mg, Oral, BID PRN, Byungura, Veronique M, NP, 500 mg at 11/21/23 0981   nicotine  (NICODERM CQ  - dosed in mg/24 hours) patch 21 mg, 21 mg, Transdermal, Daily PRN, Cox, Amy N, DO   ondansetron  (ZOFRAN -ODT) disintegrating tablet 4 mg, 4 mg, Oral, Q6H PRN, Byungura, Veronique M, NP, 4 mg at 11/22/23 1244  Allergies: No Known Allergies  Latoy Labriola, MD

## 2023-11-22 NOTE — Plan of Care (Signed)

## 2023-11-22 NOTE — Plan of Care (Signed)
  Problem: Nutrition: Goal: Adequate nutrition will be maintained Outcome: Progressing   Problem: Coping: Goal: Level of anxiety will decrease Outcome: Progressing   Problem: Elimination: Goal: Will not experience complications related to bowel motility Outcome: Progressing   Problem: Pain Managment: Goal: General experience of comfort will improve and/or be controlled Outcome: Progressing

## 2023-11-23 DIAGNOSIS — R112 Nausea with vomiting, unspecified: Secondary | ICD-10-CM | POA: Diagnosis not present

## 2023-11-23 DIAGNOSIS — F1193 Opioid use, unspecified with withdrawal: Secondary | ICD-10-CM | POA: Diagnosis not present

## 2023-11-23 LAB — CBC WITH DIFFERENTIAL/PLATELET
Abs Immature Granulocytes: 0.03 10*3/uL (ref 0.00–0.07)
Basophils Absolute: 0 10*3/uL (ref 0.0–0.1)
Basophils Relative: 0 %
Eosinophils Absolute: 0.1 10*3/uL (ref 0.0–0.5)
Eosinophils Relative: 1 %
HCT: 47.8 % (ref 39.0–52.0)
Hemoglobin: 16 g/dL (ref 13.0–17.0)
Immature Granulocytes: 0 %
Lymphocytes Relative: 44 %
Lymphs Abs: 3.2 10*3/uL (ref 0.7–4.0)
MCH: 30.7 pg (ref 26.0–34.0)
MCHC: 33.5 g/dL (ref 30.0–36.0)
MCV: 91.7 fL (ref 80.0–100.0)
Monocytes Absolute: 0.7 10*3/uL (ref 0.1–1.0)
Monocytes Relative: 10 %
Neutro Abs: 3.3 10*3/uL (ref 1.7–7.7)
Neutrophils Relative %: 45 %
Platelets: 216 10*3/uL (ref 150–400)
RBC: 5.21 MIL/uL (ref 4.22–5.81)
RDW: 13.1 % (ref 11.5–15.5)
WBC: 7.3 10*3/uL (ref 4.0–10.5)
nRBC: 0 % (ref 0.0–0.2)

## 2023-11-23 LAB — COMPREHENSIVE METABOLIC PANEL WITH GFR
ALT: 31 U/L (ref 0–44)
AST: 45 U/L — ABNORMAL HIGH (ref 15–41)
Albumin: UNDETERMINED g/dL (ref 3.5–5.0)
Alkaline Phosphatase: 59 U/L (ref 38–126)
Anion gap: 9 (ref 5–15)
BUN: UNDETERMINED mg/dL (ref 6–20)
CO2: 23 mmol/L (ref 22–32)
Calcium: 9.4 mg/dL (ref 8.9–10.3)
Chloride: 108 mmol/L (ref 98–111)
Creatinine, Ser: UNDETERMINED mg/dL (ref 0.61–1.24)
Glucose, Bld: 70 mg/dL (ref 70–99)
Potassium: 4.6 mmol/L (ref 3.5–5.1)
Sodium: 140 mmol/L (ref 135–145)
Total Bilirubin: UNDETERMINED mg/dL (ref 0.0–1.2)
Total Protein: 7.2 g/dL (ref 6.5–8.1)

## 2023-11-23 LAB — PHOSPHORUS: Phosphorus: 3.5 mg/dL (ref 2.5–4.6)

## 2023-11-23 NOTE — Plan of Care (Signed)

## 2023-11-23 NOTE — Progress Notes (Signed)
 PROGRESS NOTE    [name]  AVW:[id number] DOB: [birth year] DOA: 11/18/2023 PCP: Pcp, No  Chief Complaint  Patient presents with   Chest Pain   Emesis    Hospital Course:  Samuel Weber is a 38 year old male with history of IV drug abuse, HCV, drug overdose, tobacco abuse, MRSA, and multiple prior admissions for opioid withdrawal.  He presents on this admission with nausea vomiting.  Patient was admitted for medication assisted opioid withdrawal.  Subjective: No acute events overnight.  Patient still endorsing diffuse nausea today.  Reports he does not feel stable enough to go home.  Has asked for more frequent dosing of Reglan .  Objective: Vitals:   11/23/23 0512 11/23/23 0732 11/23/23 1124 11/23/23 1540  BP: (!) 143/82 (!) 151/84 123/87 (!) 144/88  Pulse: (!) 53 66 70 69  Resp: 20 17 18 18   Temp: 98.3 F (36.8 C) 98.4 F (36.9 C) 97.9 F (36.6 C) 98 F (36.7 C)  TempSrc:  Oral Oral Oral  SpO2: 100% 100% 100% 99%  Weight:      Height:        Intake/Output Summary (Last 24 hours) at 11/23/2023 1546 Last data filed at 11/23/2023 1421 Gross per 24 hour  Intake 240 ml  Output --  Net 240 ml   Filed Weights   11/18/23 1113  Weight: 79.4 kg    Examination: General exam: Appears calm and comfortable, NAD  Respiratory system: No work of breathing, symmetric chest wall expansion Cardiovascular system: S1 & S2 heard, RRR.  Gastrointestinal system: Abdomen is nondistended, soft and nontender.  Neuro: Alert and oriented. No focal neurological deficits. Extremities: Symmetric, expected ROM Skin: No rashes, lesions Psychiatry: Demonstrates appropriate judgement and insight. Mood & affect appropriate for situation.   Assessment & Plan:  Principal Problem:   Intractable nausea and vomiting Active Problems:   IVDU (intravenous drug user)   Polysubstance abuse (HCC)   Hypokalemia   Opioid withdrawal (HCC)   Tobacco abuse   Hypophosphatemia    Intractable  nausea and vomiting - Appears to be tolerating p.o. but continues to endorse nausea and reports he feels unwell - Continue with Zofran  and Reglan  - No indication for IV fluids at this time - Continue opioid withdrawal protocol as below  Opioid withdrawal - Was on methadone , morphine , Ativan . - Currently on clonidine  withdrawal protocol.  Continue monitor closely - Blood pressures trending down but patient is tolerating this - Continue trending CMP, correct electrolytes as needed. - Has been seen by psychiatry and recommends the following for follow-up:  [facility]; [address] Dr. Arvie Birkenhead, [phone]. He can walk in [place] or Friday 8AM to 3PM.   Tobacco abuse - Nicotine  patch  Hypokalemia Hypophosphatemia - Daily CMP, replace electrolytes as needed  Polysubstance abuse - Patient has been counseled on cessation   DVT prophylaxis: Heparin    Code Status: Full Code Disposition:  On clonidine  for withdrawal, eventually DC home.  Consultants:    Procedures:    Antimicrobials:  Anti-infectives (From admission, onward)    None       Data Reviewed: I have personally reviewed following labs and imaging studies CBC: Recent Labs  Lab 11/18/23 1141 11/19/23 0409 11/20/23 1028 11/22/23 0523 11/23/23 0655  WBC 10.0 12.8* 6.5 5.9 7.3  NEUTROABS  --   --   --  2.0 3.3  HGB 14.5 13.6 15.4 13.1 16.0  HCT 41.1 39.2 44.9 37.4* 47.8  MCV 89.7 89.7 90.3 91.0 91.7  PLT 396  144* 221 249 216   Basic Metabolic Panel: Recent Labs  Lab 11/18/23 1141 11/19/23 0409 11/22/23 0523 11/23/23 0655  NA 142 140 140 140  K 2.9* 4.8 3.8 4.6  CL 110 110 108 108  CO2 19* 20* 23 23  GLUCOSE 108* 79 101* 70  BUN 12 7 18  QUANTITY NOT SUFFICIENT, UNABLE TO PERFORM TEST  CREATININE 0.94 0.84 0.81 QUANTITY NOT SUFFICIENT, UNABLE TO PERFORM TEST  CALCIUM 9.3 8.9 8.9 9.4  MG 2.3  --  2.0  --   PHOS <1.0* 4.4 4.8* 3.5   GFR: CrCl cannot be calculated (This lab  value cannot be used to calculate CrCl because it is not a number: QUANTITY NOT SUFFICIENT, UNABLE TO PERFORM TEST). Liver Function Tests: Recent Labs  Lab 11/18/23 1206 11/22/23 0523 11/23/23 0655  AST 33 26 45*  ALT 16 19 31   ALKPHOS 70 51 59  BILITOT 1.3* 0.7 QUANTITY NOT SUFFICIENT, UNABLE TO PERFORM TEST  PROT 7.3 6.1* 7.2  ALBUMIN 4.0 3.3* QUANTITY NOT SUFFICIENT, UNABLE TO PERFORM TEST   CBG: No results for input(s): "GLUCAP" in the last 168 hours.  No results found for this or any previous visit (from the past 240 hours).   Radiology Studies: No results found.  Scheduled Meds:  cloNIDine   0.1 mg Oral BH-qamhs   Followed by   Cecily Cohen ON 11/25/2023] cloNIDine   0.1 mg Oral QAC breakfast   feeding supplement  237 mL Oral BID BM   heparin   5,000 Units Subcutaneous Q8H   Continuous Infusions:   LOS: 5 days  MDM: Patient is high risk for one or more organ failure.  They necessitate ongoing hospitalization for continued IV therapies and subsequent lab monitoring. Total time spent interpreting labs and vitals, reviewing the medical record, coordinating care amongst consultants and care team members, directly assessing and discussing care with the patient and/or family: 55 min  Myles Tavella, DO [facility]  To contact the attending physician between 7A-7P please use Epic Chat. To contact the covering physician during after hours 7P-7A, please review Amion.  11/23/2023, 3:46 PM   *This document has been created with the assistance of dictation software. Please excuse typographical errors. *

## 2023-11-23 NOTE — Plan of Care (Signed)

## 2023-11-24 DIAGNOSIS — R112 Nausea with vomiting, unspecified: Secondary | ICD-10-CM | POA: Diagnosis not present

## 2023-11-24 DIAGNOSIS — E876 Hypokalemia: Secondary | ICD-10-CM | POA: Diagnosis not present

## 2023-11-24 DIAGNOSIS — F1193 Opioid use, unspecified with withdrawal: Secondary | ICD-10-CM | POA: Diagnosis not present

## 2023-11-24 DIAGNOSIS — F1990 Other psychoactive substance use, unspecified, uncomplicated: Secondary | ICD-10-CM | POA: Diagnosis not present

## 2023-11-24 LAB — CBC WITH DIFFERENTIAL/PLATELET
Abs Immature Granulocytes: 0.02 10*3/uL (ref 0.00–0.07)
Basophils Absolute: 0 10*3/uL (ref 0.0–0.1)
Basophils Relative: 0 %
Eosinophils Absolute: 0 10*3/uL (ref 0.0–0.5)
Eosinophils Relative: 0 %
HCT: 45.7 % (ref 39.0–52.0)
Hemoglobin: 15.7 g/dL (ref 13.0–17.0)
Immature Granulocytes: 0 %
Lymphocytes Relative: 31 %
Lymphs Abs: 2.3 10*3/uL (ref 0.7–4.0)
MCH: 31.3 pg (ref 26.0–34.0)
MCHC: 34.4 g/dL (ref 30.0–36.0)
MCV: 91 fL (ref 80.0–100.0)
Monocytes Absolute: 0.7 10*3/uL (ref 0.1–1.0)
Monocytes Relative: 9 %
Neutro Abs: 4.4 10*3/uL (ref 1.7–7.7)
Neutrophils Relative %: 60 %
Platelets: 313 10*3/uL (ref 150–400)
RBC: 5.02 MIL/uL (ref 4.22–5.81)
RDW: 12.9 % (ref 11.5–15.5)
WBC: 7.5 10*3/uL (ref 4.0–10.5)
nRBC: 0 % (ref 0.0–0.2)

## 2023-11-24 LAB — COMPREHENSIVE METABOLIC PANEL WITH GFR
ALT: 45 U/L — ABNORMAL HIGH (ref 0–44)
AST: 44 U/L — ABNORMAL HIGH (ref 15–41)
Albumin: 4.1 g/dL (ref 3.5–5.0)
Alkaline Phosphatase: 57 U/L (ref 38–126)
Anion gap: 10 (ref 5–15)
BUN: 17 mg/dL (ref 6–20)
CO2: 22 mmol/L (ref 22–32)
Calcium: 9.3 mg/dL (ref 8.9–10.3)
Chloride: 107 mmol/L (ref 98–111)
Creatinine, Ser: 0.74 mg/dL (ref 0.61–1.24)
GFR, Estimated: >60 mL/min (ref >60)
Glucose, Bld: 98 mg/dL (ref 70–99)
Potassium: 3.9 mmol/L (ref 3.5–5.1)
Sodium: 139 mmol/L (ref 135–145)
Total Bilirubin: 1 mg/dL (ref 0.0–1.2)
Total Protein: 7.4 g/dL (ref 6.5–8.1)

## 2023-11-24 LAB — MAGNESIUM: Magnesium: 2.5 mg/dL — ABNORMAL HIGH (ref 1.7–2.4)

## 2023-11-24 LAB — PHOSPHORUS: Phosphorus: 2.7 mg/dL (ref 2.5–4.6)

## 2023-11-24 NOTE — Plan of Care (Signed)

## 2023-11-24 NOTE — Progress Notes (Signed)
 AVS reviewed and given to patient. PIV removed. All questions answered.

## 2023-11-24 NOTE — Discharge Summary (Signed)
 Physician Discharge Summary   Patient: Samuel Weber MRN: [id number] DOB: 1986-03-24  Admit date:     11/18/2023  Discharge date: 11/24/23  Discharge Physician: Roise Cleaver   PCP: Pcp, No   Recommendations at discharge:   Follow-up with substance abuse resources provided.  Abstain from heroin, fentanyl  and other substances.  Discharge Diagnoses: Principal Problem:   Intractable nausea and vomiting Active Problems:   IVDU (intravenous drug user)   Polysubstance abuse (HCC)   Hypokalemia   Opioid withdrawal (HCC)   Tobacco abuse   Hypophosphatemia  Resolved Problems:   * No resolved hospital problems. *  Hospital Course: Samuel Weber is a 38 year old male with history of IV drug abuse, HCV, drug overdose, tobacco abuse, MRSA, and multiple prior admissions for opioid withdrawal.  He presents on this admission with nausea vomiting.  Patient was admitted for medication assisted opioid withdrawal.  Initially patient was on methadone , morphine , Ativan .  He was evaluated by psychiatry who recommended clonidine  withdrawal protocol.  Patient was placed on this protocol and gradually improved. Electrolytes and vital signs remained stable.  He was referred to inpatient substance abuse treatment program but he has decided to defer at this time.  TOC also provided him with additional resources. On day of discharge patient requested methadone  prescription.  I had extensive discussion with him regarding his recent abuse of heroin and fentanyl  and how this would violate the methadone  contract.  He endorses understanding.  I encouraged him to follow-up with the multiple substance abuse treatment programs that were provided as a resource to him.  Intractable nausea and vomiting - Tolerating p.o.   Opioid withdrawal - Was on methadone , morphine , Ativan . - Completed clonidine  withdrawal protocol. - He is requesting methadone , discussed his abuse of this previously. Will not Rx at DC  - Has  been seen by psychiatry and recommends the following for follow-up:  [facility]; [address] Dr. Arvie Birkenhead, [phone]. He can walk in [place] or Friday 8AM to 3PM.    Tobacco abuse - Nicotine  patch   Hypokalemia Hypophosphatemia -Replaced when needed   Polysubstance abuse - Patient has been counseled on cessation    PDMP reviewed. Consultants: Psychiatry Procedures performed: n/a  Disposition: Home Diet recommendation:  Discharge Diet Orders (From admission, onward)     Start     Ordered   11/24/23 0000  Diet general        11/24/23 1153           Regular diet DISCHARGE MEDICATION: Allergies as of 11/24/2023   No Known Allergies      Medication List     STOP taking these medications    promethazine  12.5 MG tablet Commonly known as: PHENERGAN        TAKE these medications    traZODone 50 MG tablet Commonly known as: DESYREL Take 50 mg by mouth at bedtime.        Discharge Exam: Filed Weights   11/18/23 1113  Weight: 79.4 kg   Constitutional:  Normal appearance. Non toxic-appearing.  HENT: Head Normocephalic and atraumatic.  Mucous membranes are moist.  Eyes:  Extraocular intact. Conjunctivae normal. Pupils are equal, round, and reactive to light.  Cardiovascular: Rate and Rhythm: Normal rate and regular rhythm.  Pulmonary: Non labored, symmetric rise of chest wall.  Musculoskeletal:  Normal range of motion.  Skin: warm and dry. not jaundiced.  Neurological: No focal deficit present. alert. Oriented. Psychiatric: Mood and Affect congruent.    Condition at discharge: stable  The results of significant diagnostics from this hospitalization (including imaging, microbiology, ancillary and laboratory) are listed below for reference.   Imaging Studies: CT ABDOMEN PELVIS W CONTRAST Result Date: 11/18/2023 CLINICAL DATA:  Upper and right-sided abdominal pain.  Vomiting. EXAM: CT ABDOMEN AND PELVIS WITH CONTRAST TECHNIQUE:  Multidetector CT imaging of the abdomen and pelvis was performed using the standard protocol following bolus administration of intravenous contrast. RADIATION DOSE REDUCTION: This exam was performed according to the departmental dose-optimization program which includes automated exposure control, adjustment of the mA and/or kV according to patient size and/or use of iterative reconstruction technique. CONTRAST:  OMNIPAQUE  IOHEXOL  300 MG/ML  SOLN COMPARISON:  Most recent CT 10/13/2023 FINDINGS: Lower chest: Clear lung bases. Minimal wall thickening of the distal esophagus. Hepatobiliary: Focal fatty infiltration adjacent to the falciform ligament. Scattered tiny hepatic hypodensities, unchanged, too small to characterize but likely small cysts. No suspicious liver lesion. Gallbladder physiologically distended, no calcified stone. No biliary dilatation. Pancreas: No ductal dilatation or inflammation. Spleen: Normal in size without focal abnormality. Adrenals/Urinary Tract: No adrenal nodule. No hydronephrosis. No visible renal calculi. No evidence of renal inflammation. Tiny hypodensities in the kidneys, too small to characterize. No further follow-up imaging is recommended. Unremarkable urinary bladder, no bladder wall thickening. Stomach/Bowel: Minimal wall thickening of the distal esophagus. No evidence of gastric wall thickening or inflammation. No small bowel obstruction or inflammatory change. Scattered fluid within nondilated small bowel in the lower abdomen. Appendectomy per history. Small to moderate volume of stool in the colon. No colonic wall thickening. Vascular/Lymphatic: No acute vascular findings. Normal caliber abdominal aorta. Patent portal vein. No abdominopelvic adenopathy. Reproductive: Prostate is unremarkable. Other: No free air, free fluid, or intra-abdominal fluid collection. No abdominal wall hernia. Musculoskeletal: There are no acute or suspicious osseous abnormalities. Mild scoliotic  curvature of the spine. IMPRESSION: 1. Minimal wall thickening of the distal esophagus, can be seen with esophagitis. 2. Scattered fluid within nondilated small bowel in the lower abdomen, nonspecific. No bowel obstruction or inflammation. Electronically Signed   By: Chadwick Colonel M.D.   On: 11/18/2023 13:59    Microbiology: Results for orders placed or performed during the hospital encounter of 10/13/23  Culture, blood (routine x 2)     Status: None   Collection Time: 10/13/23  7:46 PM   Specimen: BLOOD RIGHT ARM  Result Value Ref Range Status   Specimen Description BLOOD RIGHT ARM  Final   Special Requests   Final    BOTTLES DRAWN AEROBIC AND ANAEROBIC Blood Culture results may not be optimal due to an inadequate volume of blood received in culture bottles   Culture   Final    NO GROWTH 5 DAYS Performed at [facility], [address], [place], [place] [address]    Report Status 10/18/2023 FINAL  Final  Culture, blood (Routine X 2) w Reflex to ID Panel     Status: None   Collection Time: 10/14/23  6:31 AM   Specimen: BLOOD  Result Value Ref Range Status   Specimen Description BLOOD R FOOT  Final   Special Requests IN PEDIATRIC BOTTLE  Final   Culture   Final    NO GROWTH 5 DAYS Performed at [facility], [address], [place], [place] [address]    Report Status 10/19/2023 FINAL  Final    Labs: CBC: Recent Labs  Lab 11/19/23 0409 11/20/23 1028 11/22/23 0523 11/23/23 0655 11/24/23 0541  WBC 12.8* 6.5 5.9 7.3 7.5  NEUTROABS  --   --  2.0 3.3 4.4  HGB 13.6 15.4 13.1 16.0 15.7  HCT 39.2 44.9 37.4* 47.8 45.7  MCV 89.7 90.3 91.0 91.7 91.0  PLT 144* 221 249 216 313   Basic Metabolic Panel: Recent Labs  Lab 11/18/23 1141 11/19/23 0409 11/22/23 0523 11/23/23 0655 11/24/23 0541  NA 142 140 140 140 139  K 2.9* 4.8 3.8 4.6 3.9  CL 110 110 108 108 107  CO2 19* 20* 23 23 22   GLUCOSE 108* 79 101* 70 98  BUN 12 7 18  QUANTITY NOT SUFFICIENT, UNABLE  TO PERFORM TEST 17  CREATININE 0.94 0.84 0.81 QUANTITY NOT SUFFICIENT, UNABLE TO PERFORM TEST 0.74  CALCIUM 9.3 8.9 8.9 9.4 9.3  MG 2.3  --  2.0  --  2.5*  PHOS <1.0* 4.4 4.8* 3.5 2.7   Liver Function Tests: Recent Labs  Lab 11/18/23 1206 11/22/23 0523 11/23/23 0655 11/24/23 0541  AST 33 26 45* 44*  ALT 16 19 31  45*  ALKPHOS 70 51 59 57  BILITOT 1.3* 0.7 QUANTITY NOT SUFFICIENT, UNABLE TO PERFORM TEST 1.0  PROT 7.3 6.1* 7.2 7.4  ALBUMIN 4.0 3.3* QUANTITY NOT SUFFICIENT, UNABLE TO PERFORM TEST 4.1   CBG: No results for input(s): "GLUCAP" in the last 168 hours.  Discharge time spent: 32 minutes.  Signed: Manmeet Arzola, DO [facility] 11/24/2023
